# Patient Record
Sex: Female | Born: 1995 | Race: Black or African American | Hispanic: No | Marital: Single | Smoking: Never smoker
Health system: Southern US, Community
[De-identification: ages and names within clinical notes are randomized; demographics above are authoritative.]

## PROBLEM LIST (undated history)

## (undated) DIAGNOSIS — Z889 Allergy status to unspecified drugs, medicaments and biological substances status: Secondary | ICD-10-CM

## (undated) DIAGNOSIS — K200 Eosinophilic esophagitis: Secondary | ICD-10-CM

## (undated) DIAGNOSIS — N200 Calculus of kidney: Secondary | ICD-10-CM

## (undated) HISTORY — DX: Calculus of kidney: N20.0

## (undated) HISTORY — DX: Allergy status to unspecified drugs, medicaments and biological substances: Z88.9

## (undated) HISTORY — DX: Eosinophilic esophagitis: K20.0

---
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
## 2014-06-17 ENCOUNTER — Telehealth: Payer: Self-pay

## 2014-06-17 NOTE — Telephone Encounter (Signed)
Patient called and left a message stating that she needs an access code for mychart. Please return call and advise. Thank you.

## 2014-06-17 NOTE — Telephone Encounter (Signed)
Generated Activation Code: Activation Code: [id number] Generate CodeInvalidate Code Code Expiration: 08/16/2014 2:37 PM Need to verify pt prior to giving her the access code.  Unable to leave message on cell phone-home phone.

## 2014-06-18 NOTE — Telephone Encounter (Signed)
Spoke to pt, access code given and read back for confirmation.

## 2014-07-03 ENCOUNTER — Other Ambulatory Visit: Payer: Self-pay | Admitting: Family Medicine

## 2014-07-03 DIAGNOSIS — N63 Unspecified lump in unspecified breast: Secondary | ICD-10-CM

## 2014-07-22 ENCOUNTER — Ambulatory Visit
Admission: RE | Admit: 2014-07-22 | Discharge: 2014-07-22 | Disposition: A | Discharge status: Home / Self Care | Payer: BC Managed Care – PPO | Source: Ambulatory Visit | Attending: Family Medicine | Admitting: Family Medicine

## 2014-07-22 DIAGNOSIS — N63 Unspecified lump in unspecified breast: Secondary | ICD-10-CM

## 2014-12-31 ENCOUNTER — Encounter: Payer: Self-pay | Admitting: Family Medicine

## 2014-12-31 ENCOUNTER — Ambulatory Visit (INDEPENDENT_AMBULATORY_CARE_PROVIDER_SITE_OTHER): Payer: BLUE CROSS/BLUE SHIELD | Admitting: Family Medicine

## 2014-12-31 VITALS — BP 94/62 | HR 62 | Temp 98.4°F | Resp 16 | Ht 66.25 in | Wt 120.0 lb

## 2014-12-31 DIAGNOSIS — Z0000 Encounter for general adult medical examination without abnormal findings: Secondary | ICD-10-CM

## 2014-12-31 DIAGNOSIS — Z87442 Personal history of urinary calculi: Secondary | ICD-10-CM | POA: Insufficient documentation

## 2014-12-31 DIAGNOSIS — R1013 Epigastric pain: Secondary | ICD-10-CM

## 2014-12-31 MED ORDER — FLUTICASONE PROPIONATE 50 MCG/ACT NA SUSP: NASAL | Status: DC

## 2014-12-31 MED ORDER — FAMOTIDINE 10 MG PO TABS: ORAL_TABLET | ORAL | Status: DC

## 2014-12-31 NOTE — Patient Instructions (Signed)
Keeping You Healthy  Get These Tests  Blood Pressure- Have your blood pressure checked once a year by your health care provider.  Normal blood pressure is 120/80.  Weight- Have your body mass index (BMI) calculated to screen for obesity.  BMI is measure of body fat based on height and weight.  You can also calculate your own BMI at GravelBags.it.  Cholesterol- Have your cholesterol checked every 5 years starting at age 20 then yearly starting at age 65.  Chlamydia, HIV, and other sexually transmitted diseases- Get screened every year until age 53, then within three months of each new sexual provider.  Pap Smear- Every 1-3 years; discuss with your health care provider.  Mammogram- Every year starting at age 64  Take these medicines  Calcium with Vitamin D-Your body needs 1200 mg of Calcium each day and [phone] IU of Vitamin D daily.  Your body can only absorb 500 mg of Calcium at a time so Calcium must be taken in 2 or 3 divided doses throughout the day.  Multivitamin with folic acid- Once daily if it is possible for you to become pregnant.  Get these Immunizations  Gardasil-Series of three doses; prevents HPV related illness such as genital warts and cervical cancer.  Menactra-Single dose; prevents meningitis.  Tetanus shot- Every 10 years.  Flu shot-Every year.  Take these steps  Do not smoke-Your healthcare provider can help you quit.  For tips on how to quit go to www.smokefree.gov or call 1-800 QUITNOW.  Be physically active- Exercise 5 days a week for at least 30 minutes.  If you are not already physically active, start slow and gradually work up to 30 minutes of moderate physical activity.  Examples of moderate activity include walking briskly, dancing, swimming, bicycling, etc.  Breast Cancer- A self breast exam every month is important for early detection of breast cancer.  For more information and instruction on self breast exams, ask your healthcare  provider or https://www.patel.info/.  Eat a healthy diet- Eat a variety of healthy foods such as fruits, vegetables, whole grains, low fat milk, low fat cheeses, yogurt, lean meats, poultry and fish, beans, nuts, tofu, etc.  For more information go to www. Thenutritionsource.org  Drink alcohol in moderation- Limit alcohol intake to one drink or less per day. Never drink and drive.  Depression- Your emotional health is as important as your physical health.  If you're feeling down or losing interest in things you normally enjoy please talk to your healthcare provider about being screened for depression.  Dental visit- Brush and floss your teeth twice daily; visit your dentist twice a year.  Eye doctor- Get an eye exam at least every 2 years.  Helmet use- Always wear a helmet when riding a bicycle, motorcycle, rollerblading or skateboarding.  Safe sex- If you may be exposed to sexually transmitted infections, use a condom.  Seat belts- Seat belts can save your live; always wear one.  Smoke/Carbon Monoxide detectors- These detectors need to be installed on the appropriate level of your home. Replace batteries at least once a year.  Skin cancer- When out in the sun please cover up and use sunscreen 15 SPF or higher.  Violence- If anyone is threatening or hurting you, please tell your healthcare provider.    Indigestion  Indigestion is discomfort in the upper belly (abdomen). HOME CARE  Avoid foods and drinks that make your problems worse. You may want to avoid:  Caffeine and alcohol.  Chocolate.  Peppermint.  Garlic  and onions.  Spicy foods.  Citrus fruits, such as oranges, lemons, or limes.  Tomato-based foods such as sauce, chili, salsa, and pizza.  Fried and fatty foods.  Avoid eating for 3 hours before your bedtime.  Eat small meals instead of large meals more often.  Stop smoking if you smoke.  Maintain a healthy weight.  Wear  loose-fitting clothing. Do not wear anything tight around your waist.  Raise the head of your bed 4 to 8 inches with wood blocks.  Only take medicines as told by your doctor.  Do not take aspirin or ibuprofen. GET HELP RIGHT AWAY IF:  You are not better after 2 days.  You have chest pain that goes into your neck, arms, back, jaw, or upper belly.  You have trouble swallowing.  You keep throwing up (vomiting).  You have black or bloody poop (stool).  You have a fever.  You have trouble breathing, you feel dizzy, or you pass out (faint).  You are sweating a lot.  You have severe belly pain.  You lose weight without trying. MAKE SURE YOU:  Understand these instructions.  Will watch your condition.  Will get help right away if you are not doing well or get worse. Document Released: 01/13/2011 Document Revised: 03/04/2012 Document Reviewed: 07/26/2011 [facility] Patient Information 2015 [place], [place]. This information is not intended to replace advice given to you by your health care provider. Make sure you discuss any questions you have with your health care provider.    Mylanta is safe for you to take; take the lowest effective dose and make changes in your dietary habits. Another over-the-counter medication that works is [employer].     HPV Vaccine Gardasil (Human Papillomavirus): What You Need to Know 1. What is HPV? Genital human papillomavirus (HPV) is the most common sexually transmitted virus in the [place]. More than half of sexually active men and women are infected with HPV at some time in their lives. About 20 million Americans are currently infected, and about 6 million more get infected each year. HPV is usually spread through sexual contact. Most HPV infections don't cause any symptoms, and go away on their own. But HPV can cause cervical cancer in women. Cervical cancer is the 2nd leading cause of cancer deaths among women around the world. In the  [place], about 12,000 women get cervical cancer every year and about 4,000 are expected to die from it. HPV is also associated with several less common cancers, such as vaginal and vulvar cancers in women, and anal and oropharyngeal (back of the throat, including base of tongue and tonsils) cancers in both men and women. HPV can also cause genital warts and warts in the throat. There is no cure for HPV infection, but some of the problems it causes can be treated. 2. HPV vaccine: Why get vaccinated? The HPV vaccine you are getting is one of two vaccines that can be given to prevent HPV. It may be given to both males and females.  This vaccine can prevent most cases of cervical cancer in females, if it is given before exposure to the virus. In addition, it can prevent vaginal and vulvar cancer in females, and genital warts and anal cancer in both males and females. Protection from HPV vaccine is expected to be long-lasting. But vaccination is not a substitute for cervical cancer screening. Women should still get regular Pap tests. 3. Who should get this HPV vaccine and when? HPV vaccine is given  as a 3-dose series  1st Dose: Now  2nd Dose: 1 to 2 months after Dose 1  3rd Dose: 6 months after Dose 1 Additional (booster) doses are not recommended. Routine vaccination  This HPV vaccine is recommended for girls and boys 2 or 20 years of age. It may be given starting at age 47. Why is HPV vaccine recommended at 43 or 20 years of age?  HPV infection is easily acquired, even with only one sex partner. That is why it is important to get HPV vaccine before any sexual contact takes place. Also, response to the vaccine is better at this age than at older ages. Catch-up vaccination This vaccine is recommended for the following people who have not completed the 3-dose series:   Females 54 through 20 years of age.  Males 79 through 20 years of age. This vaccine may be given to men 45 through 20  years of age who have not completed the 3-dose series. It is recommended for men through age 76 who have sex with men or whose immune system is weakened because of HIV infection, other illness, or medications.  HPV vaccine may be given at the same time as other vaccines. 4. Some people should not get HPV vaccine or should wait.  Anyone who has ever had a life-threatening allergic reaction to any component of HPV vaccine, or to a previous dose of HPV vaccine, should not get the vaccine. Tell your doctor if the person getting vaccinated has any severe allergies, including an allergy to yeast.  HPV vaccine is not recommended for pregnant women. However, receiving HPV vaccine when pregnant is not a reason to consider terminating the pregnancy. Women who are breast feeding may get the vaccine.  People who are mildly ill when a dose of HPV is planned can still be vaccinated. People with a moderate or severe illness should wait until they are better. 5. What are the risks from this vaccine? This HPV vaccine has been used in the [place] and around the world for about six years and has been very safe. However, any medicine could possibly cause a serious problem, such as a severe allergic reaction. The risk of any vaccine causing a serious injury, or death, is extremely small. Life-threatening allergic reactions from vaccines are very rare. If they do occur, it would be within a few minutes to a few hours after the vaccination. Several mild to moderate problems are known to occur with this HPV vaccine. These do not last long and go away on their own.  Reactions in the arm where the shot was given:  Pain (about 8 people in 10)  Redness or swelling (about 1 person in 4)  Fever:  Mild (100 F) (about 1 person in 10)  Moderate (102 F) (about 1 person in 76)  Other problems:  Headache (about 1 person in 3)  Fainting: Brief fainting spells and related symptoms (such as jerking movements) can happen  after any medical procedure, including vaccination. Sitting or lying down for about 15 minutes after a vaccination can help prevent fainting and injuries caused by falls. Tell your doctor if the patient feels dizzy or light-headed, or has vision changes or ringing in the ears.  Like all vaccines, HPV vaccines will continue to be monitored for unusual or severe problems. 6. What if there is a serious reaction? What should I look for?  Look for anything that concerns you, such as signs of a severe allergic reaction, very high fever, or  behavior changes. Signs of a severe allergic reaction can include hives, swelling of the face and throat, difficulty breathing, a fast heartbeat, dizziness, and weakness. These would start a few minutes to a few hours after the vaccination.  What should I do?  If you think it is a severe allergic reaction or other emergency that can't wait, call 9-1-1 or get the person to the nearest hospital. Otherwise, call your doctor.  Afterward, the reaction should be reported to the Vaccine Adverse Event Reporting System (VAERS). Your doctor might file this report, or you can do it yourself through the VAERS web site at www.vaers.SamedayNews.es, or by calling 858-546-6615. VAERS is only for reporting reactions. They do not give medical advice. 7. The National Vaccine Injury Compensation Program  The [employer] Vaccine Injury Compensation Program (VICP) is a federal program that was created to compensate people who may have been injured by certain vaccines.  Persons who believe they may have been injured by a vaccine can learn about the program and about filing a claim by calling 325-317-5517 or visiting the [place] website at GoldCloset.com.ee. 8. How can I learn more?  Ask your doctor.  Call your local or state health department.  Contact the Centers for Disease Control and Prevention (CDC):  Call 913-258-3193 (1-800-CDC-INFO)  or  Visit CDC's website at  http://hunter.com/ CDC Human Papillomavirus (HPV) Gardasil (Interim) 05/10/12 Document Released: 10/08/2006 Document Revised: 04/27/2014 Document Reviewed: 01/22/2014 [employer] Patient Information 2015 [place], [place]. This information is not intended to replace advice given to you by your health care provider. Make sure you discuss any questions you have with your health care provider.

## 2014-12-31 NOTE — Progress Notes (Signed)
Subjective:    Patient ID: April Osborne, female    DOB: 1996/04/29, 19 y.o.   MRN: [id number]  HPI  This 19 y.o. Female is here for CPE (no PAP- not sexually active). Pt is a Secondary school teacher at [employer], [employer] course of study. She has hx of mild anemia and fatigue which responded to oral iron. She reports improved nutrition since she consumes many of her meals in the campus cafeteria.   Patient Active Problem List   Diagnosis Date Noted  . Personal history of kidney stones 12/31/2014  . Acne 05/31/2013  . Allergic rhinitis 12/27/2012  . Benign breast lumps 12/27/2012  . Scoliosis 02/06/2012    Prior to Admission medications   Medication Sig Start Date End Date Taking? Authorizing Provider  cetirizine (ZYRTEC) 10 MG tablet Take 10 mg by mouth daily.   Yes Historical Provider, MD  OVER THE COUNTER MEDICATION daily.   Yes Historical Provider, MD  tretinoin (RETIN-A) 0.1 % cream Apply sparingly to clean dry skin on acne-prone areas on face and upper lip. Use at bedtime. 05/30/13  Yes Barton Fanny, MD  fluticasone [facility]) 50 MCG/ACT nasal spray Spray 1-2 times in each nostril at bedtime.    Barton Fanny, MD    History   Social History  . Marital Status: Single    Spouse Name: N/A    Number of Children: N/A  . Years of Education: N/A   Occupational History  . Student- [employer].    Social History Main Topics  . Smoking status: Never Smoker   . Smokeless tobacco: Never Used  . Alcohol Use: No  . Drug Use: No  . Sexual Activity: No   Other Topics Concern  . Not on file   Social History Narrative    Family History  Problem Relation Age of Onset  . Hypertension Father   . Cancer Paternal Grandmother   . Diabetes Maternal Grandfather   . Dementia Maternal Grandfather   . Hypertension Mother     Review of Systems  Constitutional: Negative.   HENT: Negative.   Eyes: Negative.   Cardiovascular: Negative.   Gastrointestinal:  Negative.        Occasional gas and stomach aches; increased symptoms when eating late and going to bed on full stomach. Mylanta is effective.  Endocrine: Negative.   Genitourinary: Negative.        Irregular menses for 2 months this summer- short 21-day cycle but bleeding lasts 3-4 days. Pt is not sexually active.  Musculoskeletal: Negative.   Skin: Negative.   Allergic/Immunologic: Positive for environmental allergies.  Neurological: Negative.   Hematological: Negative.   Psychiatric/Behavioral: Negative.        Objective:   Physical Exam  Constitutional: She is oriented to person, place, and time. Vital signs are normal. She appears well-developed and well-nourished. No distress.  HENT:  Head: Normocephalic and atraumatic.  Right Ear: Hearing, tympanic membrane, external ear and ear canal normal.  Left Ear: Hearing, tympanic membrane, external ear and ear canal normal.  Nose: Nose normal. No nasal deformity or septal deviation.  Mouth/Throat: Uvula is midline, oropharynx is clear and moist and mucous membranes are normal. No oral lesions. Normal dentition. No dental caries. No oropharyngeal exudate.  Eyes: Conjunctivae, EOM and lids are normal. Pupils are equal, round, and reactive to light. No scleral icterus.  Fundoscopic exam:      The right eye shows no papilledema. The right eye shows red reflex.  The left eye shows no papilledema. The left eye shows red reflex.  Neck: Trachea normal, normal range of motion, full passive range of motion without pain and phonation normal. Neck supple. No spinous process tenderness and no muscular tenderness present. No thyroid mass and no thyromegaly present.  Cardiovascular: Normal rate, regular rhythm, S1 normal, S2 normal, normal heart sounds and normal pulses.  PMI is not displaced.   No murmur heard. Pulmonary/Chest: Effort normal and breath sounds normal. No respiratory distress.  Abdominal: Soft. Normal appearance and bowel sounds  are normal. She exhibits no distension and no mass. There is no hepatosplenomegaly. There is no tenderness. There is no guarding and no CVA tenderness.  Genitourinary:  Deferred.  Musculoskeletal:       Cervical back: Normal.       Thoracic back: Normal.       Lumbar back: Normal.  Remainder of exam unremarkable.  Lymphadenopathy:       Head (right side): No submental, no submandibular, no tonsillar, no preauricular, no posterior auricular and no occipital adenopathy present.       Head (left side): No submental, no submandibular, no tonsillar, no preauricular, no posterior auricular and no occipital adenopathy present.    She has no cervical adenopathy.    She has no axillary adenopathy.       Right: No inguinal and no supraclavicular adenopathy present.       Left: No inguinal and no supraclavicular adenopathy present.  Neurological: She is alert and oriented to person, place, and time. She has normal strength and normal reflexes. She displays no atrophy. No cranial nerve deficit or sensory deficit. She exhibits normal muscle tone. Coordination and gait normal.  Skin: Skin is warm and intact. No ecchymosis, no lesion and no rash noted. She is not diaphoretic. No cyanosis or erythema. Nails show no clubbing.  Psychiatric: She has a normal mood and affect. Her speech is normal and behavior is normal. Judgment and thought content normal. Cognition and memory are normal.  Nursing note and vitals reviewed.      Assessment & Plan:  Healthcare maintenance- Advised about Gardisil vaccine; info given.  Dyspepsia- Pt uses Mylanta but concerned about risk of kidney stones. Trial Pepcid AC 20 mg daily.   Meds ordered this encounter  Medications  . fluticasone (FLONASE) 50 MCG/ACT nasal spray    Sig: Spray 1-2 times in each nostril at bedtime.    Dispense:  16 g    Refill:  5  . famotidine (PEPCID AC) 10 MG tablet    Sig: Take 2 tablets once a day.    Dispense:  60 tablet    Refill:  2

## 2015-04-27 ENCOUNTER — Telehealth: Payer: Self-pay | Admitting: Family Medicine

## 2015-04-27 NOTE — Telephone Encounter (Signed)
No answer on cell voice mail box not set up home number disconnected

## 2015-07-27 ENCOUNTER — Other Ambulatory Visit: Payer: Self-pay | Admitting: Family Medicine

## 2015-07-27 DIAGNOSIS — D242 Benign neoplasm of left breast: Principal | ICD-10-CM

## 2015-07-27 DIAGNOSIS — D241 Benign neoplasm of right breast: Secondary | ICD-10-CM

## 2015-08-12 ENCOUNTER — Other Ambulatory Visit: Payer: Self-pay | Admitting: Physician Assistant

## 2015-08-12 DIAGNOSIS — D241 Benign neoplasm of right breast: Secondary | ICD-10-CM

## 2015-08-12 DIAGNOSIS — D242 Benign neoplasm of left breast: Principal | ICD-10-CM

## 2015-08-13 ENCOUNTER — Ambulatory Visit
Admission: RE | Admit: 2015-08-13 | Discharge: 2015-08-13 | Disposition: A | Discharge status: Home / Self Care | Payer: BLUE CROSS/BLUE SHIELD | Source: Ambulatory Visit | Attending: Family Medicine | Admitting: Family Medicine

## 2015-08-13 DIAGNOSIS — D241 Benign neoplasm of right breast: Secondary | ICD-10-CM

## 2015-08-13 DIAGNOSIS — D242 Benign neoplasm of left breast: Principal | ICD-10-CM

## 2016-01-04 ENCOUNTER — Encounter: Payer: BLUE CROSS/BLUE SHIELD | Admitting: Family Medicine

## 2016-12-03 IMAGING — US US BREAST LTD UNI RIGHT INC AXILLA
1 series · 8 of 8 positions shown · non-contrast
Comparison: Previous exam(s).

CLINICAL DATA: Year followup for probably benign masses in each
breast. These were initially evaluated in May 2013.

EXAM:
ULTRASOUND OF THE BILATERAL BREAST

[Series 1: us breast ltd uni right inc axilla · 0.05mm/px · 8 of 8 slices shown]
[im 1/8]
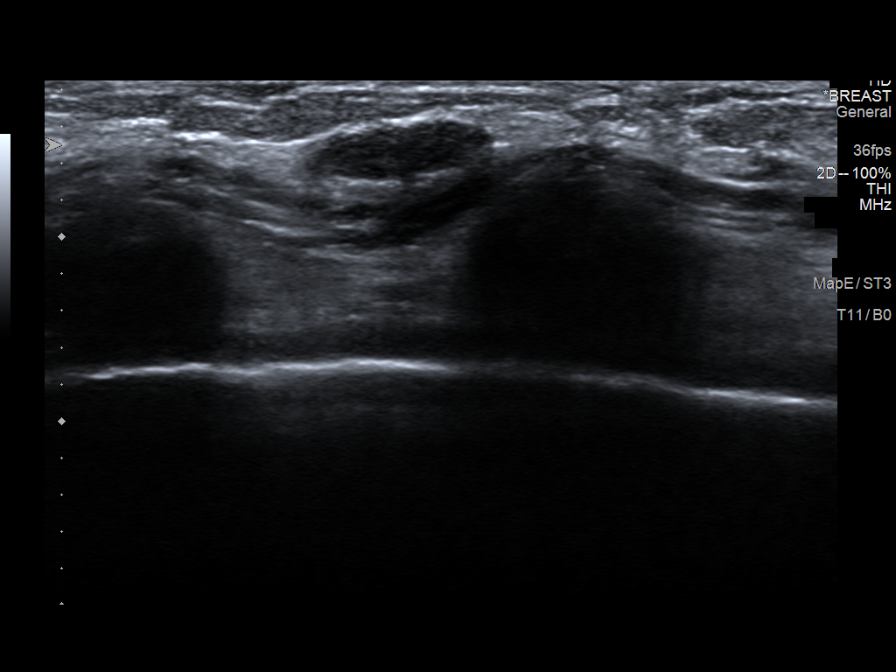
[im 2/8]
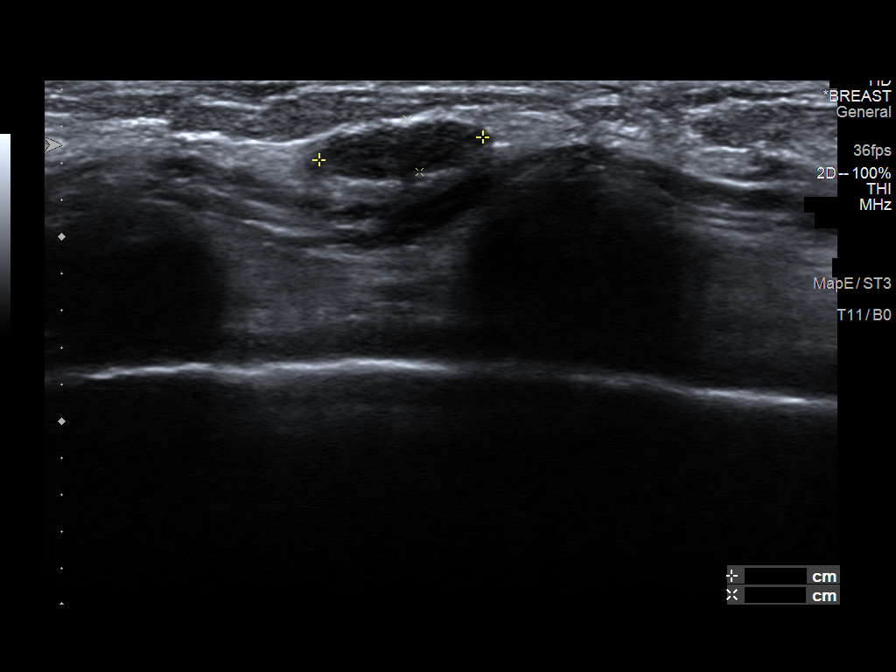
[im 3/8]
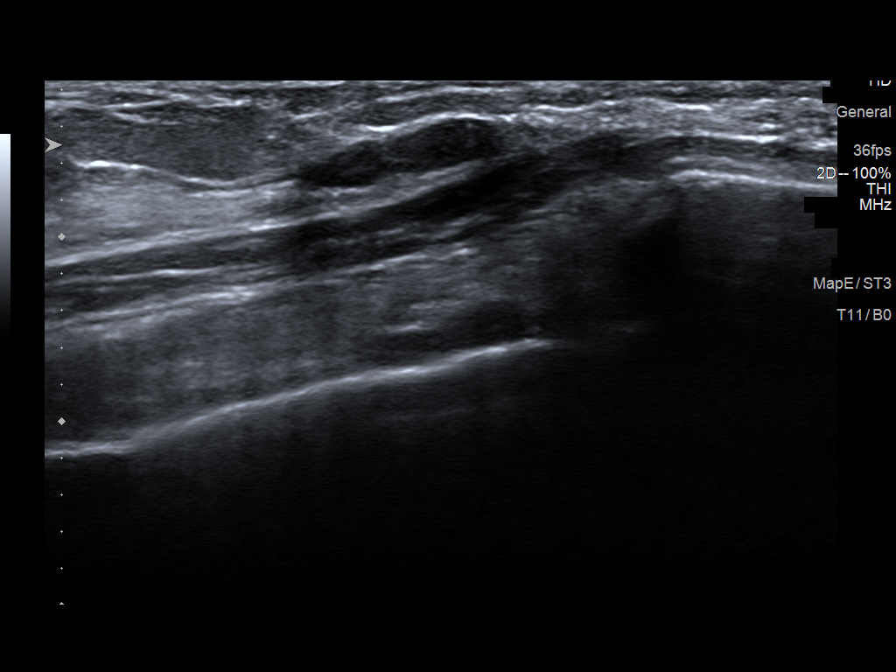
[im 4/8]
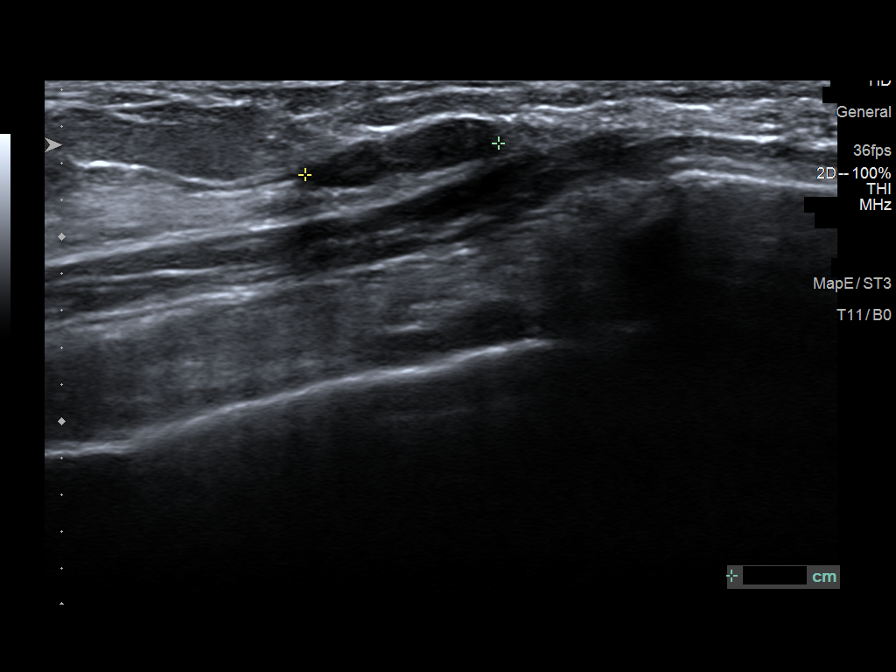
[im 5/8]
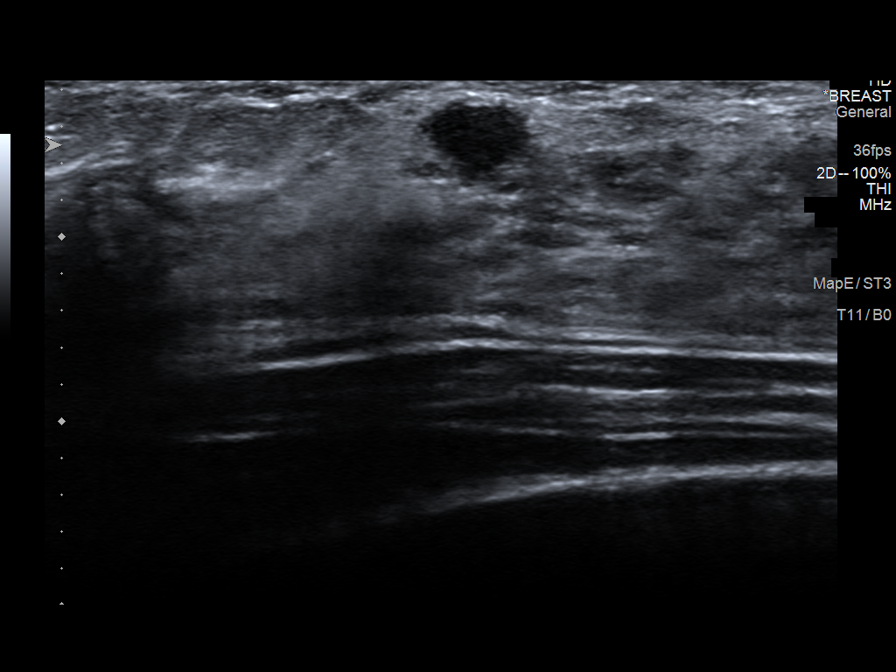
[im 6/8]
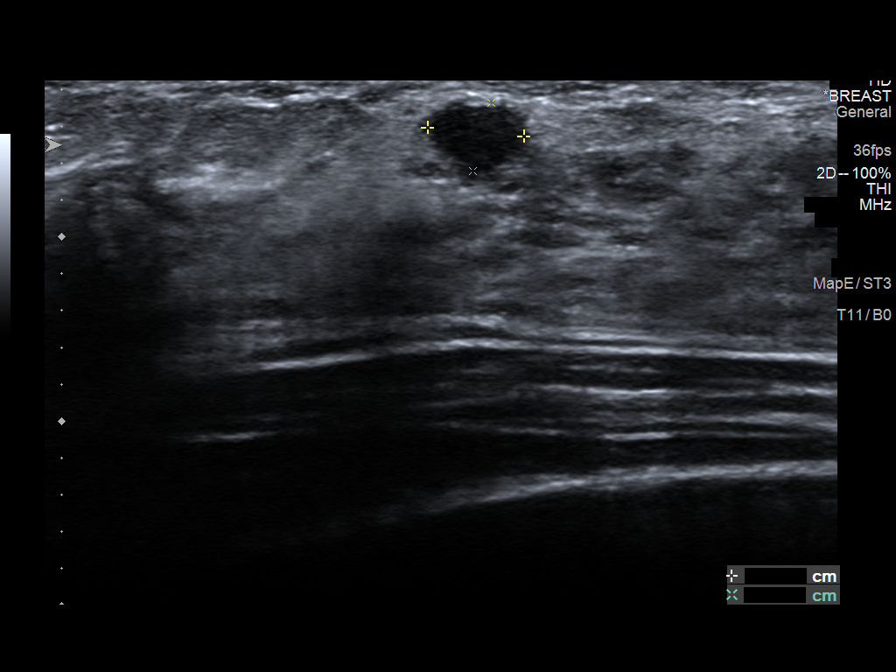
[im 7/8]
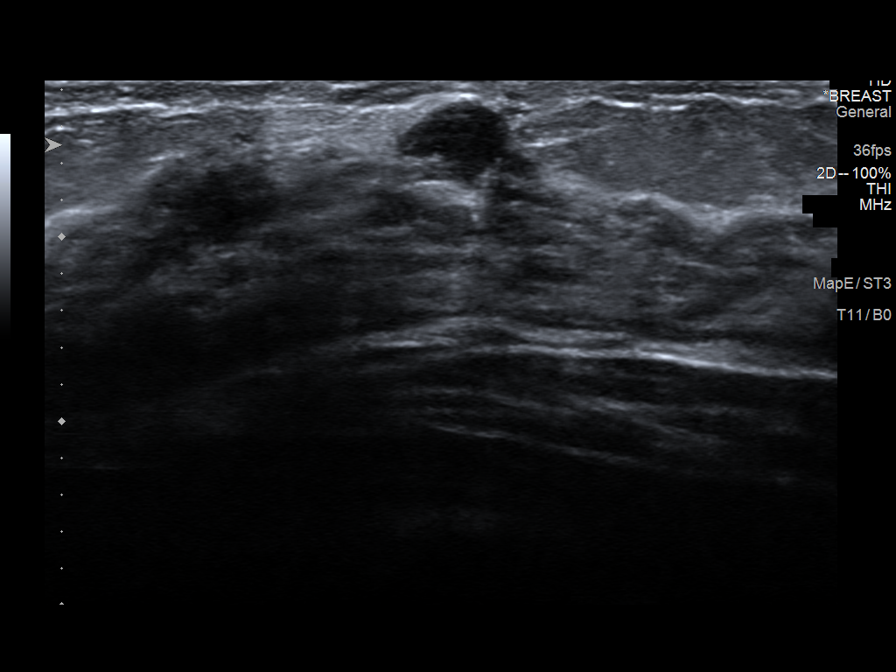
[im 8/8]
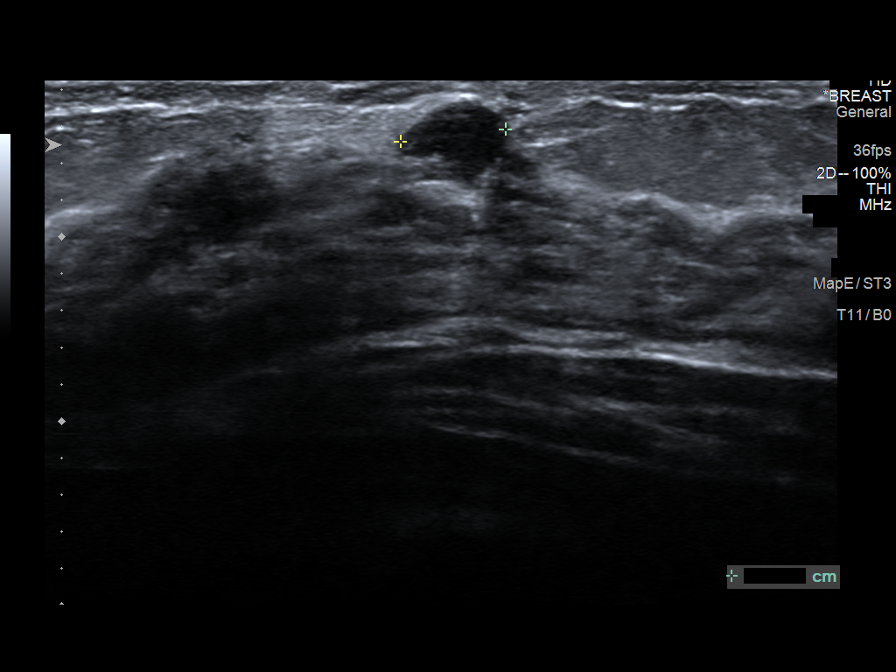

[8 of 8 positions shown; findings below may reference images not displayed]

FINDINGS: On physical exam, smooth palpable mobile masses noted in the left
breast at 9 o'clock.

Targeted ultrasound of the left breast demonstrates 2 masses, the
larger palpable mass at 9 o'clock, 3 cm the nipple, measuring 2.8 x
1.2 x 2.6 cm. The smaller mass lies at the 2 o'clock position, 3 cm
the nipple, measuring 1.7 x 0.4 x 1.7 cm. Targeted ultrasound of the
right breast demonstrates 2 masses, 1 in the 3 o'clock position, 7
cm the nipple, measuring 9 mm x 3 mm x 11 mm and the other in the 10
o'clock position, 3 cm the nipple on measuring 5 mm x 4 mm x 6 mm.

Allowing for slight differences in measurement technique, there has
been no change in these masses, in either breast, over a 2 year
interval. These are benign fibroadenomas.
IMPRESSION: Bilateral benign breast masses. No additional short-term followup
indicated.

RECOMMENDATION:
Screening mammogram at age 39 unless there are persistent or
intervening clinical concerns. (Code:SR-T-XZQ)

I have discussed the findings and recommendations with the patient.
Results were also provided in writing at the conclusion of the
visit. If applicable, a reminder letter will be sent to the patient
regarding the next appointment.

BI-RADS CATEGORY  2: Benign Finding(s)

## 2016-12-03 IMAGING — US US BREAST LTD UNI LEFT INC AXILLA
1 series · 8 of 8 positions shown · non-contrast
Comparison: Previous exam(s).

CLINICAL DATA: Year followup for probably benign masses in each
breast. These were initially evaluated in May 2013.

EXAM:
ULTRASOUND OF THE BILATERAL BREAST

[Series 1: us breast ltd uni left inc axilla · 0.07mm/px · 8 of 8 slices shown]
[im 1/8]
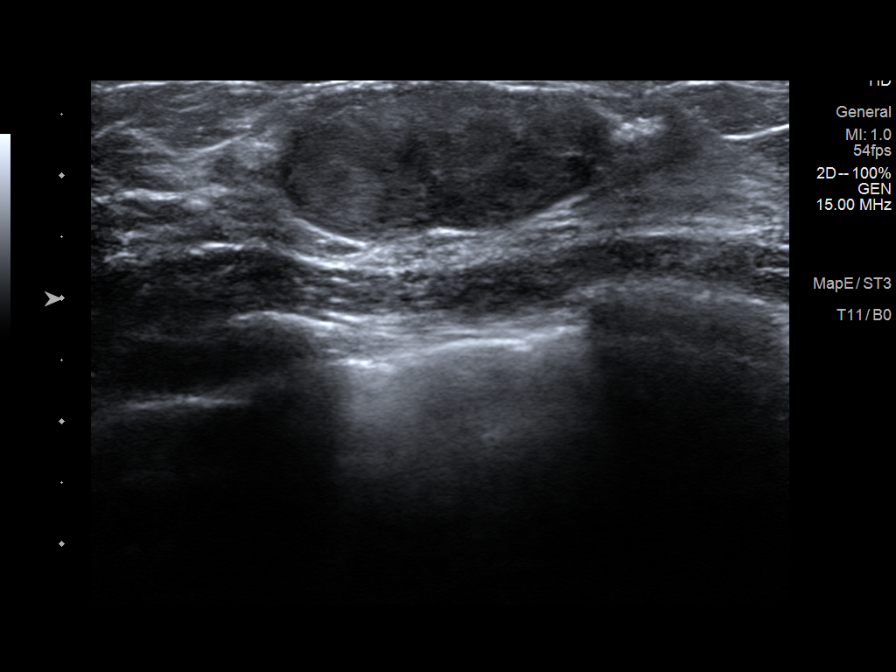
[im 2/8]
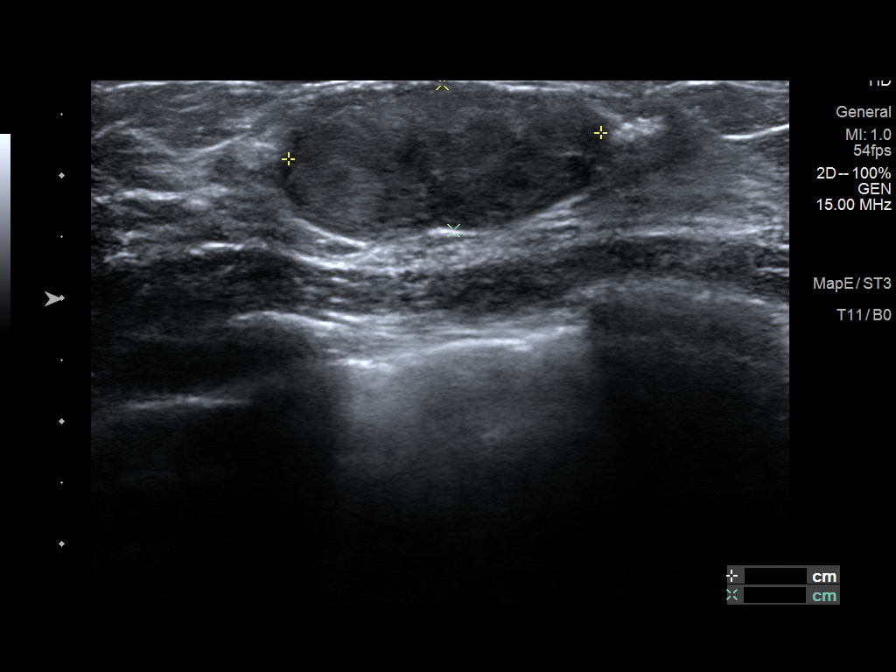
[im 3/8]
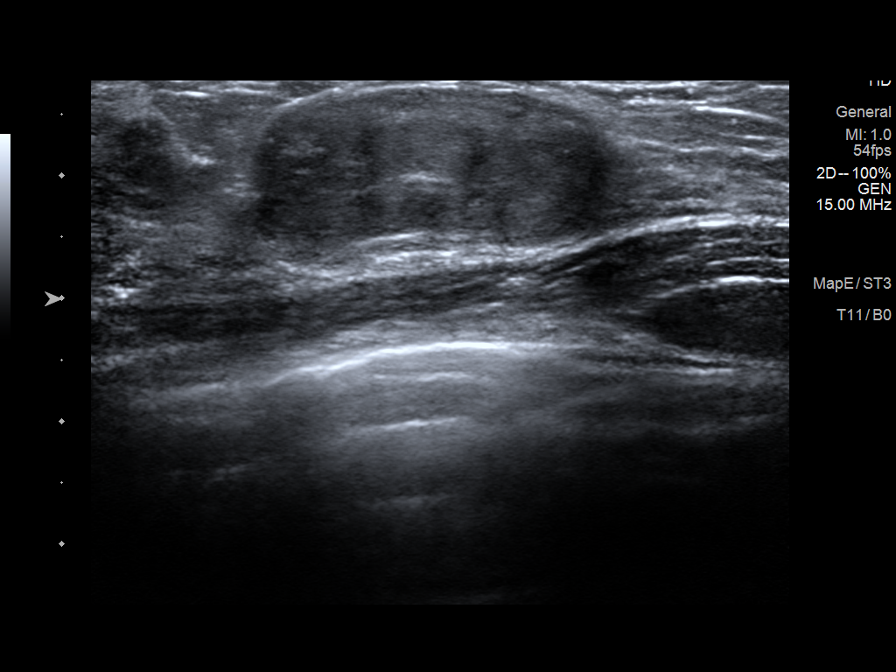
[im 4/8]
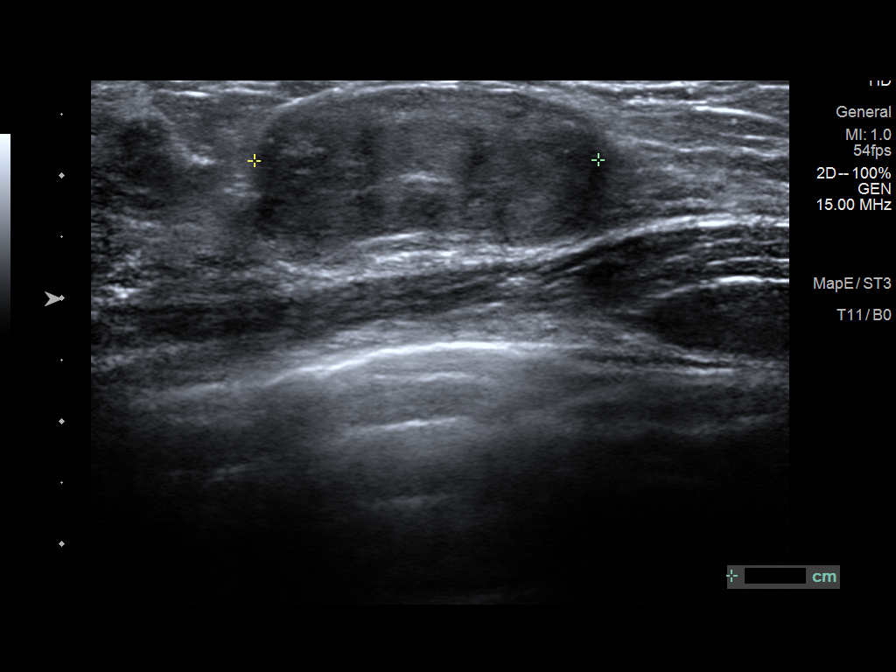
[im 5/8]
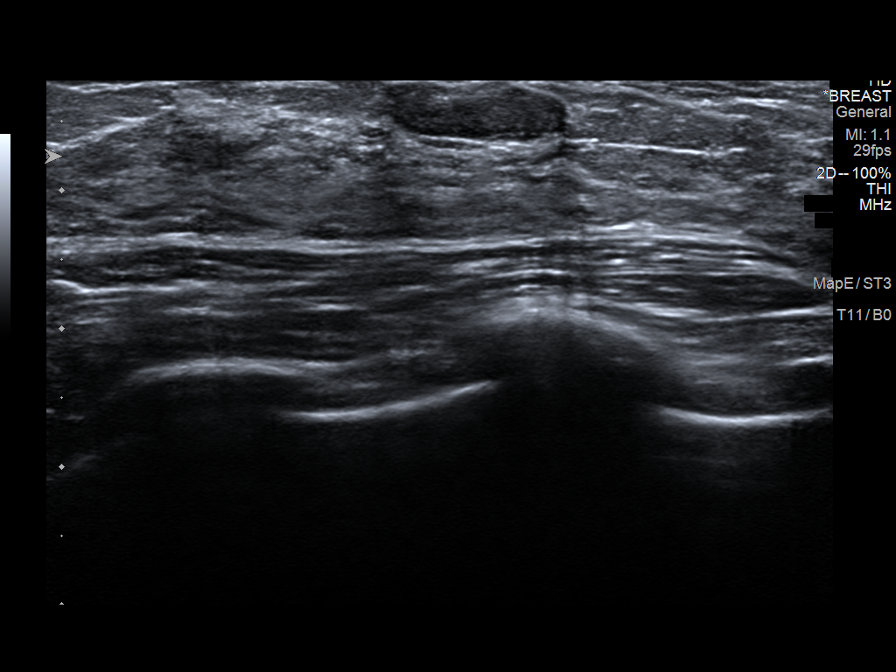
[im 6/8]
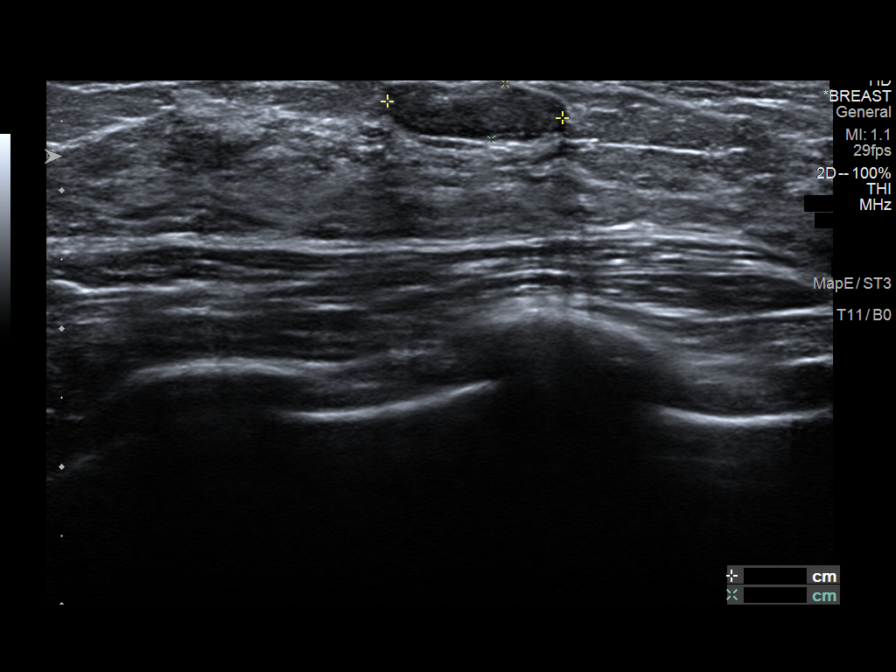
[im 7/8]
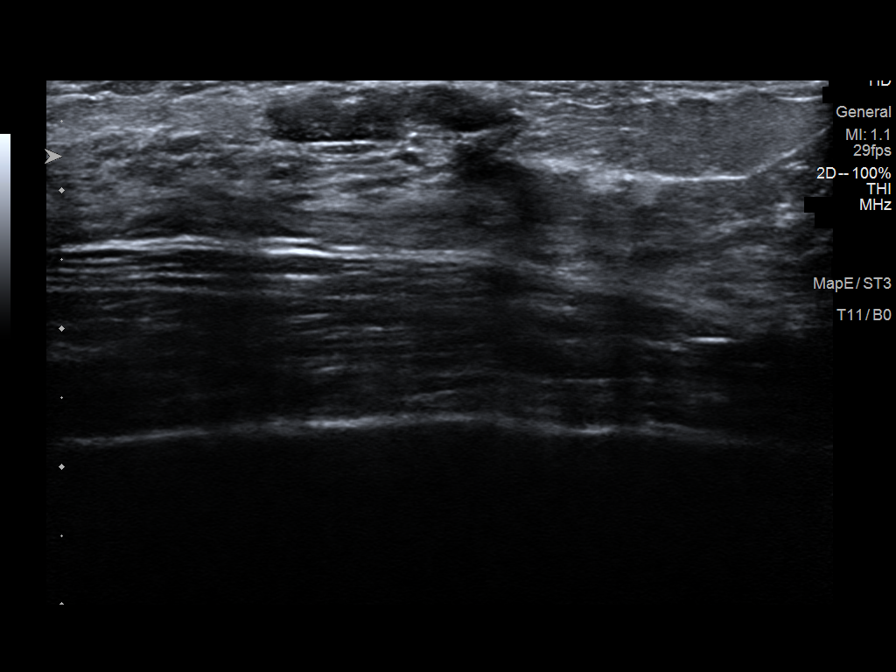
[im 8/8]
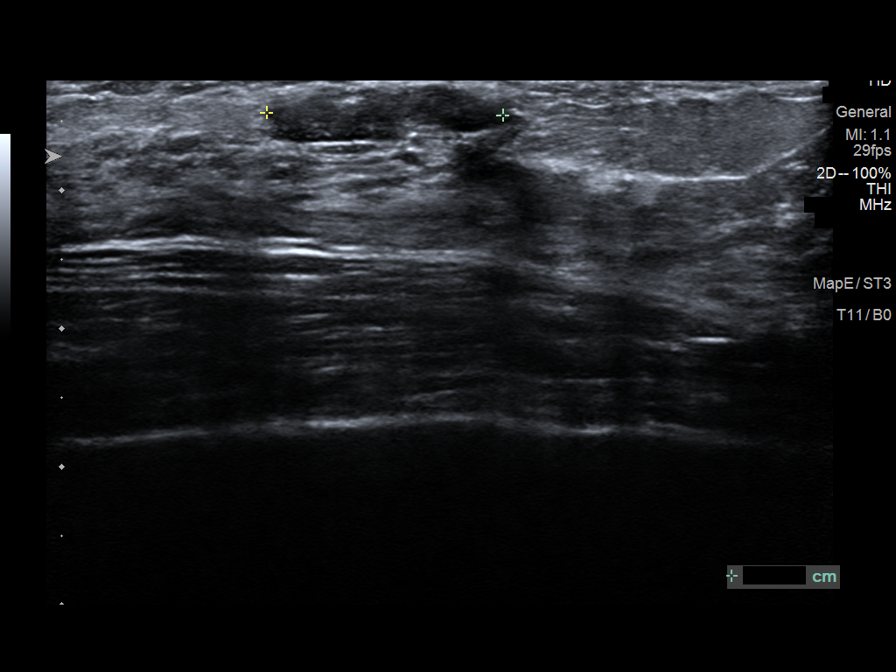

[8 of 8 positions shown; findings below may reference images not displayed]

FINDINGS: On physical exam, smooth palpable mobile masses noted in the left
breast at 9 o'clock.

Targeted ultrasound of the left breast demonstrates 2 masses, the
larger palpable mass at 9 o'clock, 3 cm the nipple, measuring 2.8 x
1.2 x 2.6 cm. The smaller mass lies at the 2 o'clock position, 3 cm
the nipple, measuring 1.7 x 0.4 x 1.7 cm. Targeted ultrasound of the
right breast demonstrates 2 masses, 1 in the 3 o'clock position, 7
cm the nipple, measuring 9 mm x 3 mm x 11 mm and the other in the 10
o'clock position, 3 cm the nipple on measuring 5 mm x 4 mm x 6 mm.

Allowing for slight differences in measurement technique, there has
been no change in these masses, in either breast, over a 2 year
interval. These are benign fibroadenomas.
IMPRESSION: Bilateral benign breast masses. No additional short-term followup
indicated.

RECOMMENDATION:
Screening mammogram at age 39 unless there are persistent or
intervening clinical concerns. (Code:SR-T-XZQ)

I have discussed the findings and recommendations with the patient.
Results were also provided in writing at the conclusion of the
visit. If applicable, a reminder letter will be sent to the patient
regarding the next appointment.

BI-RADS CATEGORY  2: Benign Finding(s)

## 2017-06-19 ENCOUNTER — Other Ambulatory Visit: Payer: Self-pay | Admitting: Physician Assistant

## 2017-06-19 DIAGNOSIS — N63 Unspecified lump in unspecified breast: Secondary | ICD-10-CM

## 2017-07-10 ENCOUNTER — Ambulatory Visit
Admission: RE | Admit: 2017-07-10 | Discharge: 2017-07-10 | Disposition: A | Discharge status: Home / Self Care | Payer: BLUE CROSS/BLUE SHIELD | Source: Ambulatory Visit | Attending: Physician Assistant | Admitting: Physician Assistant

## 2017-07-10 ENCOUNTER — Other Ambulatory Visit: Payer: Self-pay | Admitting: Physician Assistant

## 2017-07-10 DIAGNOSIS — N63 Unspecified lump in unspecified breast: Secondary | ICD-10-CM

## 2017-07-10 DIAGNOSIS — D242 Benign neoplasm of left breast: Secondary | ICD-10-CM

## 2018-01-14 ENCOUNTER — Ambulatory Visit
Admission: RE | Admit: 2018-01-14 | Discharge: 2018-01-14 | Disposition: A | Discharge status: Home / Self Care | Payer: BLUE CROSS/BLUE SHIELD | Source: Ambulatory Visit | Attending: Physician Assistant | Admitting: Physician Assistant

## 2018-01-14 ENCOUNTER — Other Ambulatory Visit: Payer: Self-pay | Admitting: Physician Assistant

## 2018-01-14 DIAGNOSIS — D242 Benign neoplasm of left breast: Secondary | ICD-10-CM

## 2018-07-15 ENCOUNTER — Other Ambulatory Visit: Payer: BLUE CROSS/BLUE SHIELD

## 2018-07-16 ENCOUNTER — Ambulatory Visit
Admission: RE | Admit: 2018-07-16 | Discharge: 2018-07-16 | Disposition: A | Discharge status: Home / Self Care | Payer: BLUE CROSS/BLUE SHIELD | Source: Ambulatory Visit | Attending: Physician Assistant | Admitting: Physician Assistant

## 2018-07-16 DIAGNOSIS — D242 Benign neoplasm of left breast: Secondary | ICD-10-CM

## 2018-07-17 DIAGNOSIS — R12 Heartburn: Secondary | ICD-10-CM | POA: Insufficient documentation

## 2018-07-17 DIAGNOSIS — K200 Eosinophilic esophagitis: Secondary | ICD-10-CM | POA: Insufficient documentation

## 2018-08-21 DIAGNOSIS — D242 Benign neoplasm of left breast: Secondary | ICD-10-CM

## 2018-08-21 DIAGNOSIS — D241 Benign neoplasm of right breast: Secondary | ICD-10-CM | POA: Insufficient documentation

## 2018-09-14 ENCOUNTER — Emergency Department (INDEPENDENT_AMBULATORY_CARE_PROVIDER_SITE_OTHER)
Admission: EM | Admit: 2018-09-14 | Discharge: 2018-09-14 | Disposition: A | Discharge status: Home / Self Care | Payer: BLUE CROSS/BLUE SHIELD | Source: Home / Self Care | Attending: Family Medicine | Admitting: Family Medicine

## 2018-09-14 ENCOUNTER — Other Ambulatory Visit: Payer: Self-pay

## 2018-09-14 DIAGNOSIS — R000 Tachycardia, unspecified: Secondary | ICD-10-CM

## 2018-09-14 DIAGNOSIS — R112 Nausea with vomiting, unspecified: Secondary | ICD-10-CM | POA: Diagnosis not present

## 2018-09-14 DIAGNOSIS — R42 Dizziness and giddiness: Secondary | ICD-10-CM

## 2018-09-14 DIAGNOSIS — E860 Dehydration: Secondary | ICD-10-CM | POA: Diagnosis not present

## 2018-09-14 MED ORDER — ONDANSETRON 4 MG PO TBDP: ORAL_TABLET | ORAL | 0 refills | Status: DC

## 2018-09-14 MED ORDER — SODIUM CHLORIDE 0.9 % IV SOLN
Freq: Once | INTRAVENOUS | Status: AC
  Administered 2018-09-14: 15:00:00 via INTRAVENOUS

## 2018-09-14 NOTE — Discharge Instructions (Signed)
°  Please follow up with family medicine later this week if symptoms keep returning.  Please call 911 or go to the closest hospital if symptoms worsening.

## 2018-09-14 NOTE — ED Triage Notes (Signed)
IV inserted by Candie Chroman RN.

## 2018-09-14 NOTE — ED Triage Notes (Signed)
Here with c/o nausea and vomiting after drinking 4 shots of Rum last night. Sx's started 12am this am. [facility] and given Zofran IM with relief. Mentined sinus sx's as well but is taking OTC Sudafed, Benadryl with relief. VSS

## 2018-09-14 NOTE — ED Provider Notes (Signed)
[facility]    CSN: [id number] Arrival date & time: 09/14/18  1355     History   Chief Complaint Chief Complaint  Patient presents with  . GI Problem    HPI April Osborne is a 22 y.o. female.   HPI  April Osborne is a 22 y.o. female presenting to UC with parents with c/o nausea and vomiting x8 since 12AM this morning after drinking 4 shots of Rum last night. She also reports having sinus congestion so she has been taking benadryl at least twice a day for the last week. She was taking Sudafed but stopped taking earlier in the week.  She has only been able to keep down a few sips of Gatorade today. Last episode of vomiting was around 11AM.  She was at a football game just PTA and felt dizzy and lightheaded. She was seen by a medic at the stadium and was given Zofran IM. Nausea has resolved. Pt was brought to UC for further evaluation of fluctuating heart rate, reported tachycardia. No prior known heart conditions. No family hx of heart problems.  Denies chest pain or palpitations at this time. Denies HA or dizziness at this time.    Past Medical History:  Diagnosis Date  . H/O seasonal allergies     Patient Active Problem List   Diagnosis Date Noted  . Personal history of kidney stones 12/31/2014  . Acne 05/31/2013  . Allergic rhinitis 12/27/2012  . Benign breast lumps 12/27/2012  . Scoliosis 02/06/2012    No past surgical history on file.  OB History   None      Home Medications    Prior to Admission medications   Medication Sig Start Date End Date Taking? Authorizing Provider  cetirizine (ZYRTEC) 10 MG tablet Take 10 mg by mouth daily.    [provider]  famotidine (PEPCID AC) 10 MG tablet Take 2 tablets once a day. 12/31/14   Barton Fanny, MD  fluticasone (FLONASE) 50 MCG/ACT nasal spray Spray 1-2 times in each nostril at bedtime. 12/31/14   Barton Fanny, MD  ondansetron (ZOFRAN ODT) 4 MG disintegrating tablet 4mg  ODT q4 hours prn  nausea/vomit 09/14/18   Miaa Latterell, Bronwen Betters, PA-C  OVER THE COUNTER MEDICATION daily.    [provider]  tretinoin (RETIN-A) 0.1 % cream Apply sparingly to clean dry skin on acne-prone areas on face and upper lip. Use at bedtime. 05/30/13   Barton Fanny, MD    Family History Family History  Problem Relation Age of Onset  . Hypertension Mother   . Hypertension Father   . Cancer Paternal Grandmother   . Diabetes Maternal Grandfather   . Dementia Maternal Grandfather     Social History Social History   Tobacco Use  . Smoking status: Never Smoker  . Smokeless tobacco: Never Used  Substance Use Topics  . Alcohol use: No  . Drug use: No     Allergies   Patient has no known allergies.   Review of Systems Review of Systems  Constitutional: Negative for chills and fever.  HENT: Positive for congestion and rhinorrhea. Negative for sinus pressure and sinus pain.   Respiratory: Negative for cough and shortness of breath.   Gastrointestinal: Positive for abdominal pain (mild, generalized), nausea and vomiting. Negative for diarrhea.  Genitourinary: Negative for dysuria, frequency, hematuria, pelvic pain and urgency.  Neurological: Positive for dizziness and light-headedness. Negative for syncope, numbness and headaches.     Physical Exam Triage Vital Signs  ED Triage Vitals [09/14/18 1435]  Enc Vitals Group     BP 123/70     Pulse Rate (!) 119     Resp      Temp 98.2 F (36.8 C)     Temp Source Oral     SpO2 100 %     Weight 119 lb 12.8 oz (54.3 kg)     Height      Head Circumference      Peak Flow      Pain Score      Pain Loc      Pain Edu?      Excl. in [place]?    Orthostatic VS for the past 24 hrs:  BP- Lying Pulse- Lying BP- Sitting Pulse- Sitting BP- Standing at 0 minutes Pulse- Standing at 0 minutes  09/14/18 1614 116/73 104 122/78 98 122/88 106  09/14/18 1515 121/81 87 - - (!) 131/93 101    Updated Vital Signs BP 123/70 (BP Location: Right Arm)    Pulse (!) 119   Temp 98.2 F (36.8 C) (Oral)   Wt 119 lb 12.8 oz (54.3 kg)   LMP 08/27/2018   SpO2 100%   BMI 19.19 kg/m   Visual Acuity Right Eye Distance:   Left Eye Distance:   Bilateral Distance:    Right Eye Near:   Left Eye Near:    Bilateral Near:     Physical Exam  Constitutional: She is oriented to person, place, and time. She appears well-developed and well-nourished. No distress.  HENT:  Head: Normocephalic and atraumatic.  Right Ear: Tympanic membrane normal.  Left Ear: Tympanic membrane normal.  Nose: Nose normal. Right sinus exhibits no maxillary sinus tenderness and no frontal sinus tenderness. Left sinus exhibits no maxillary sinus tenderness and no frontal sinus tenderness.  Mouth/Throat: Uvula is midline, oropharynx is clear and moist and mucous membranes are normal.  Eyes: EOM are normal.  Neck: Normal range of motion. Neck supple.  Cardiovascular: Normal rate and regular rhythm.  Pulmonary/Chest: Effort normal and breath sounds normal. No stridor. No respiratory distress. She has no wheezes. She has no rales.  Musculoskeletal: Normal range of motion.  Neurological: She is alert and oriented to person, place, and time. No cranial nerve deficit.  CN II-XII in tact. Speech is clear. Alert to person, place and time. Normal coordination. Normal gait.   Skin: Skin is warm and dry. Capillary refill takes less than 2 seconds. No rash noted. She is not diaphoretic.  Psychiatric: She has a normal mood and affect. Her behavior is normal.  Nursing note and vitals reviewed.    UC Treatments / Results  Labs (all labs ordered are listed, but only abnormal results are displayed) Labs Reviewed - No data to display  EKG Date/Time: 09/14/2018   15:01:08 Ventricular Rate: 84 PR Interval: 138 QRS Duration: 82 QT Interval: 374 QTC Calculation: 441 P-R-T axes: 78   76   59 Text Interpretation: Normal sinus rhythm with sinus arrhythmia. Normal ECG     Radiology No results found.  Procedures Procedures (including critical care time)  Medications Ordered in UC Medications  0.9 %  sodium chloride infusion ( Intravenous Stopped 09/14/18 1611)    Initial Impression / Assessment and Plan / UC Course  I have reviewed the triage vital signs and the nursing notes.  Pertinent labs & imaging results that were available during my care of the patient were reviewed by me and considered in my medical decision making (see chart for  details).    Hx and exam c/w dehydration, likely from viral illness as well as possible vomiting from recent liquor consumption.  Pt was given IV fluids in UC States she feels much better. Orthostatic vitals improved after fluids. Encouraged continued fluids and rest at home.  Final Clinical Impressions(s) / UC Diagnoses   Final diagnoses:  Nausea and vomiting in adult  Tachycardia  Mild dehydration  Lightheadedness     Discharge Instructions      Please follow up with family medicine later this week if symptoms keep returning.  Please call 911 or go to the closest hospital if symptoms worsening.     ED Prescriptions    Medication Sig Dispense Auth. Provider   ondansetron (ZOFRAN ODT) 4 MG disintegrating tablet 4mg  ODT q4 hours prn nausea/vomit 6 tablet Noe Gens, PA-C     Controlled Substance Prescriptions [place] Controlled Substance Registry consulted? Not Applicable   Tyrell Antonio 09/15/18 1138

## 2018-10-31 IMAGING — US ULTRASOUND RIGHT BREAST LIMITED
1 series · 10 of 10 positions shown · non-contrast
Comparison: Bilateral breast ultrasound 08/13/2015, 07/22/2014,
12/15/2013, 06/18/2013.

CLINICAL DATA: 20-year-old with palpable masses in both breasts. 2
years of sonographic stability was demonstrated for masses in both
breasts in 5239, though the patient recently saw a new provider, and
the patient has a new palpable lump in the upper outer quadrant of
the left breast near the axillary tail.

EXAM:
LIMITED ULTRASOUND OF THE BILATERAL BREASTS

[Series 1: ultrasound right breast limited · 0.03mm/px · 10 of 10 slices shown]
[im 1/10]
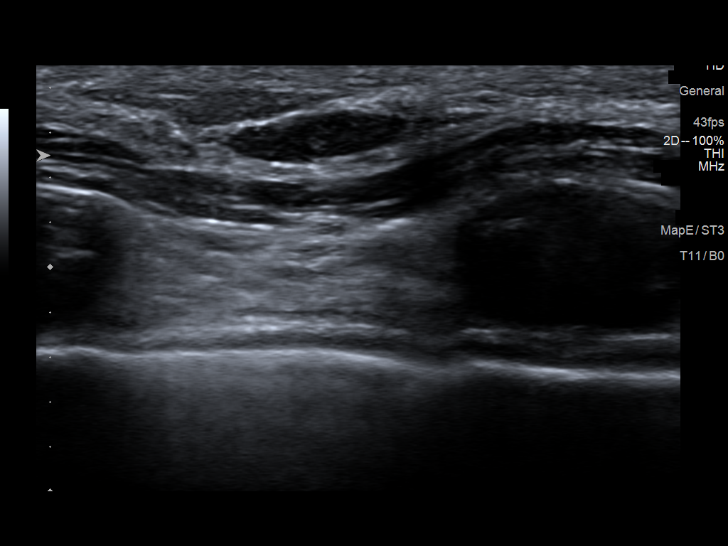
[im 2/10]
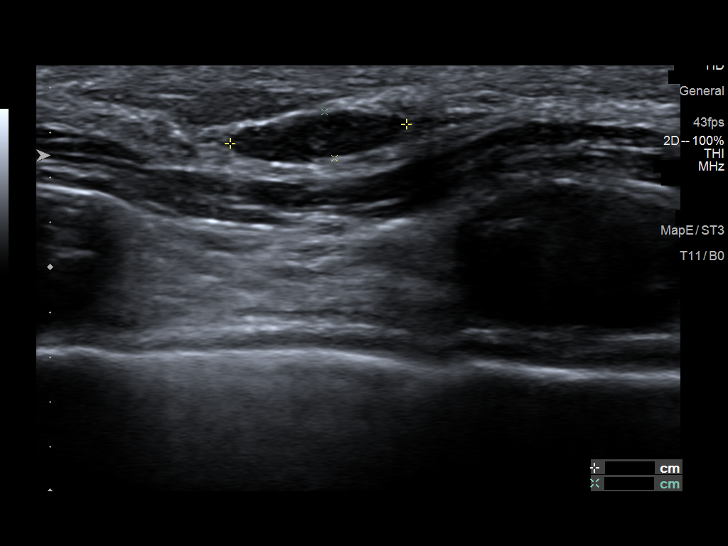
[im 3/10]
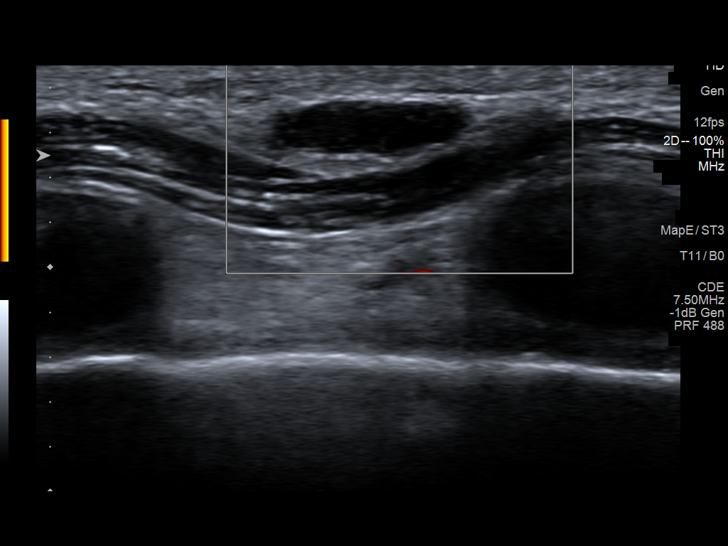
[im 4/10]
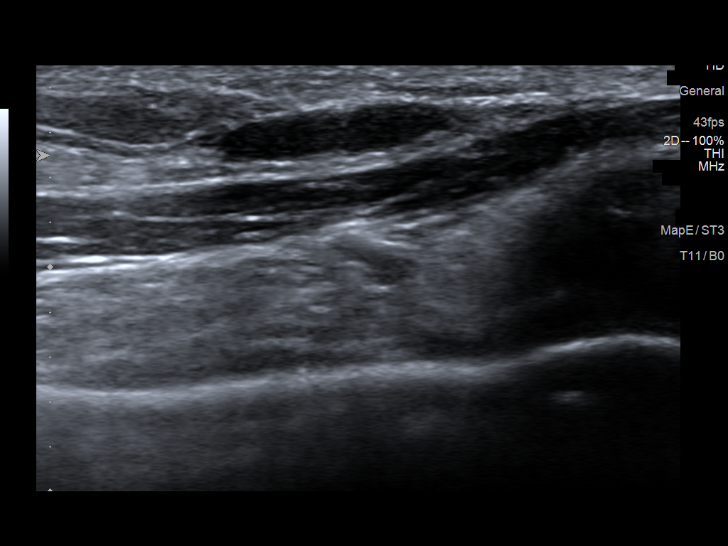
[im 5/10]
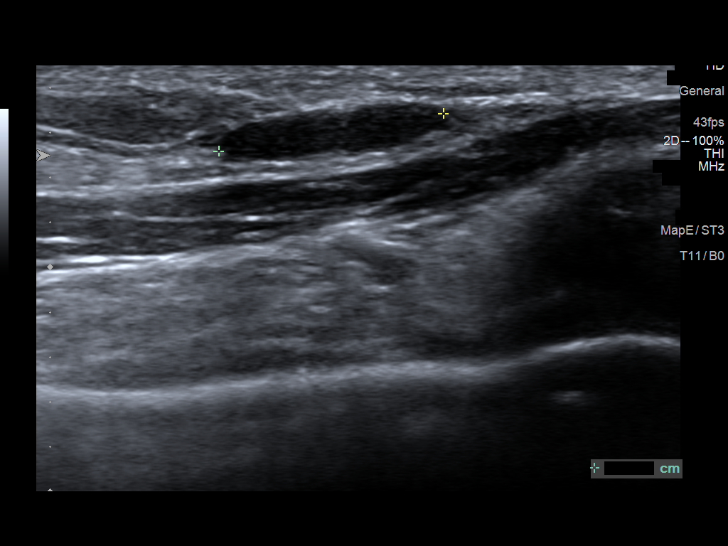
[im 6/10]
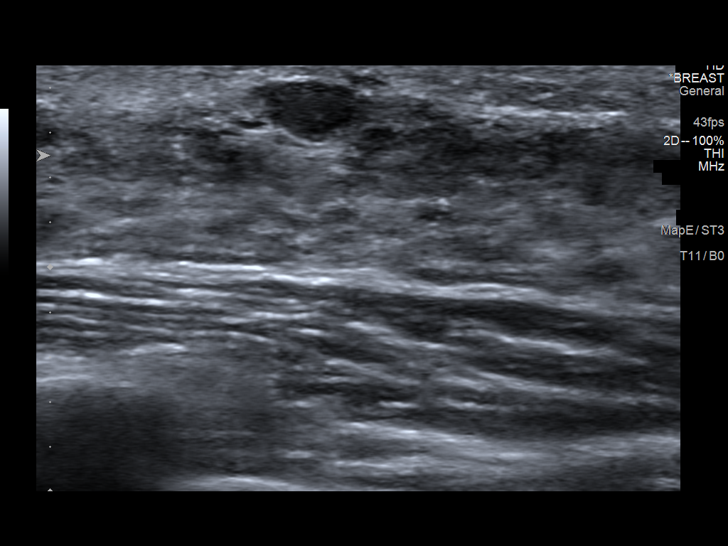
[im 7/10]
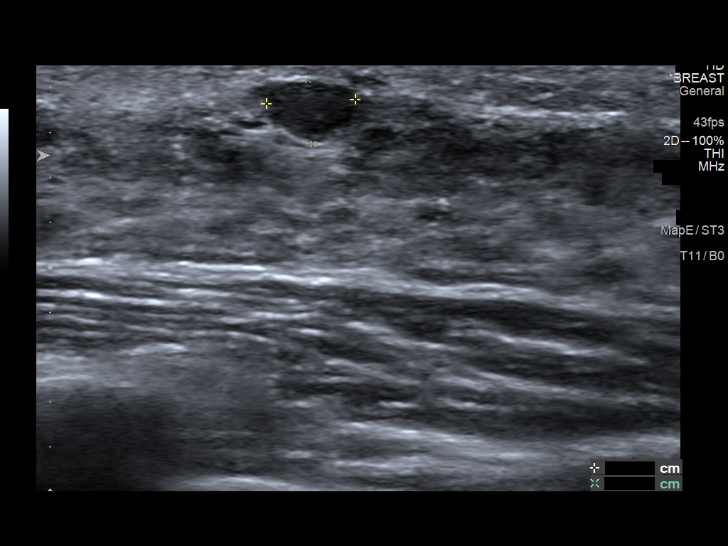
[im 8/10]
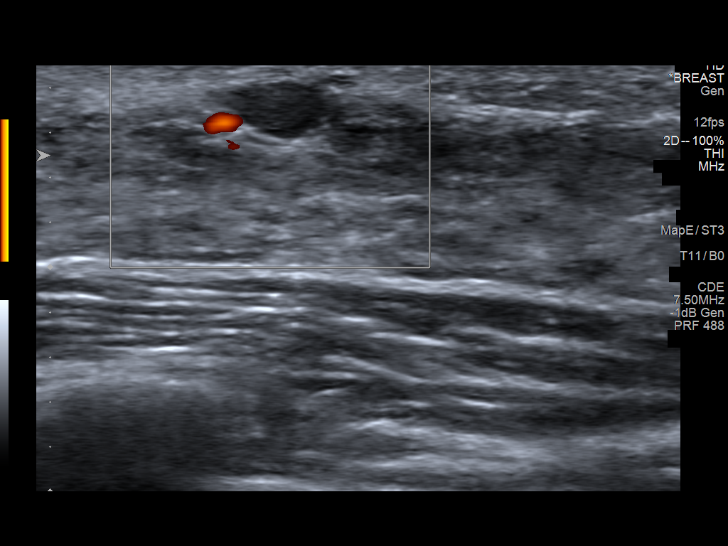
[im 9/10]
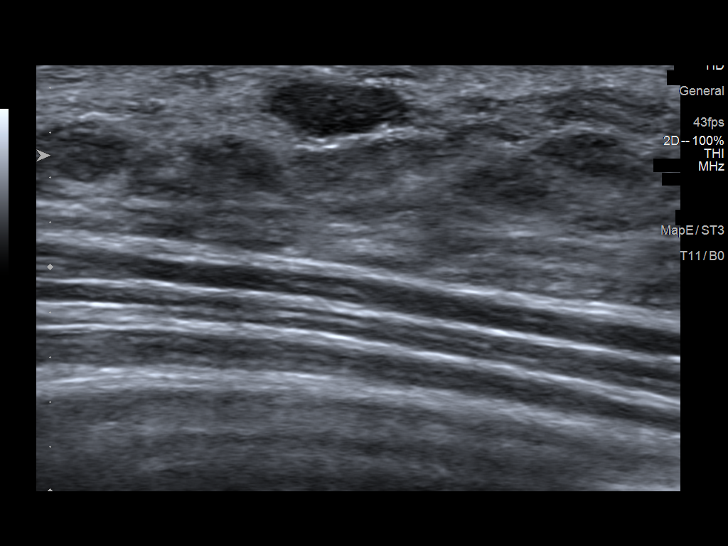
[im 10/10]
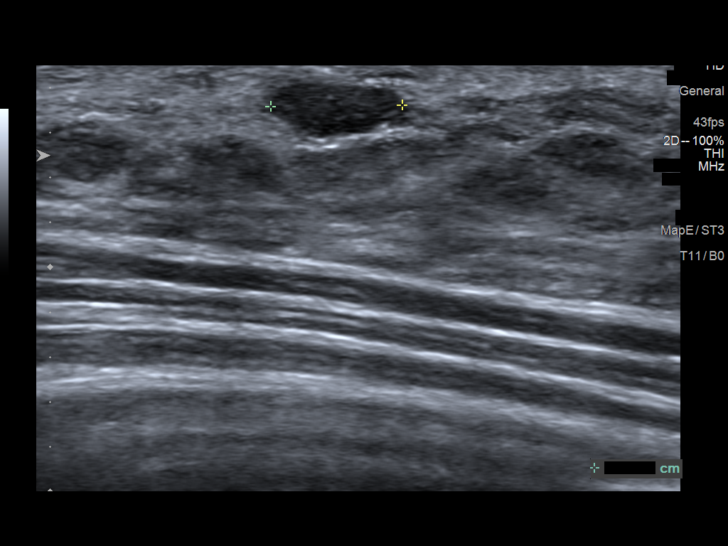

[10 of 10 positions shown; findings below may reference images not displayed]

FINDINGS: On physical exam, there is a palpable approximate 1-2 cm mass in the
upper outer quadrant of the left breast near the axillary tail
corresponding to the new palpable finding.

Targeted right breast ultrasound is performed, showing the
previously identified likely benign fibroadenomas. The oval
circumscribed parallel hypoechoic mass at the 3 o'clock position
approximately 7 cm from the nipple measures approximately 0.2 x
x 1.0 cm, unchanged when compared to the July 2015 ultrasound.
The oval circumscribed parallel hypoechoic mass at the 10 o'clock
position approximately 3 cm from the nipple measures approximately
0.3 x 0.4 x 0.6 cm, also unchanged. No new suspicious solid mass or
abnormal acoustic shadowing is identified in the right breast.

Targeted left breast ultrasound is performed, showing an oval
circumscribed parallel hypoechoic mass at the 1 o'clock position
approximately 4 cm from the nipple, measuring approximately 0.9 x
1.5 x 2.1 cm, demonstrating posterior acoustic enhancement and
demonstrating internal power Doppler flow, corresponding to the new
palpable lump. This has a similar appearance to the previously
identified likely benign fibroadenomas.

The previously identified oval circumscribed parallel hypoechoic
mass at the 9 o'clock position approximately 3 cm from the nipple
measures approximately 0.9 x 2.4 x 2.6 cm, unchanged when compared
to the July 2015 ultrasound. The oval circumscribed parallel
hypoechoic mass at the 2 o'clock position approximately 3 cm from
the nipple measures approximately 0.3 x 1.2 x 1.3 cm, slightly
decreased in size since the prior ultrasound.

Adjacent anechoic masses are identified at the 8 o'clock position
approximately 2 cm from the nipple, each demonstrating posterior
acoustic enhancement, the larger measuring approximately 0.8 x 1.4 x
1.5 cm.

No suspicious solid mass or abnormal acoustic shadowing is
identified in the left breast.
IMPRESSION: 1. Approximate 2.1 cm likely benign fibroadenoma involving the upper
outer quadrant of the left breast at the 1 o'clock position
approximately 4 cm from the nipple, accounting for a new area of
palpable concern.
2. Stable benign fibroadenomas at the 9 o'clock position of the left
breast and at the 2 o'clock position of the left breast dating back
to 7417.
3. Adjacent benign simple cysts in the lower outer quadrant of the
left breast at the 8 o'clock position.
4. Stable benign fibroadenomas at the 3 o'clock position of the
right breast and at the 10 o'clock position of the right breast
dating back to 7417.

RECOMMENDATION:
Left breast ultrasound in 6 months to confirm stability of the new
likely benign fibroadenoma in the upper outer quadrant at the 1
o'clock position approximately 4 cm from the nipple.

I have discussed the findings and recommendations with the patient.
Results were also provided in writing at the conclusion of the
visit. If applicable, a reminder letter will be sent to the patient
regarding the next appointment.

BI-RADS CATEGORY  3: Probably benign.

## 2018-11-14 ENCOUNTER — Ambulatory Visit: Payer: BLUE CROSS/BLUE SHIELD | Admitting: Internal Medicine

## 2018-11-19 ENCOUNTER — Encounter: Payer: Self-pay | Admitting: Internal Medicine

## 2018-11-19 ENCOUNTER — Ambulatory Visit (INDEPENDENT_AMBULATORY_CARE_PROVIDER_SITE_OTHER): Payer: BLUE CROSS/BLUE SHIELD | Admitting: Internal Medicine

## 2018-11-19 VITALS — BP 102/66 | HR 72 | Temp 98.1°F | Ht 66.2 in | Wt 124.6 lb

## 2018-11-19 DIAGNOSIS — M545 Low back pain: Secondary | ICD-10-CM | POA: Diagnosis not present

## 2018-11-19 DIAGNOSIS — M4134 Thoracogenic scoliosis, thoracic region: Secondary | ICD-10-CM | POA: Diagnosis not present

## 2018-11-19 DIAGNOSIS — G8929 Other chronic pain: Secondary | ICD-10-CM

## 2018-11-19 DIAGNOSIS — M546 Pain in thoracic spine: Secondary | ICD-10-CM

## 2018-11-19 MED ORDER — TIZANIDINE HCL 4 MG PO TABS: 4.0000 mg | ORAL_TABLET | Freq: Three times a day (TID) | ORAL | 0 refills | Status: DC

## 2018-11-19 NOTE — Patient Instructions (Signed)
Muscle Cramps and Spasms Muscle cramps and spasms are when muscles tighten by themselves. They usually get better within minutes. Muscle cramps are painful. They are usually stronger and last longer than muscle spasms. Muscle spasms may or may not be painful. They can last a few seconds or much longer. Follow these instructions at home:  Drink enough fluid to keep your pee (urine) clear or pale yellow.  Massage, stretch, and relax the muscle.  If directed, apply heat to tight or tense muscles as often as told by your doctor. Use the heat source that your doctor recommends. ? Place a towel between your skin and the heat source. ? Leave the heat on for 20-30 minutes. ? Take off the heat if your skin turns bright red. This is especially important if you are unable to feel pain, heat, or cold. You may have a greater risk of getting burned.  If directed, put ice on the affected area. This may help if you are sore or have pain after a cramp or spasm. ? Put ice in a plastic bag. ? Place a towel between your skin and the bag. ? Leave the ice on for 20 minutes, 2-3 times a day.  Take over-the-counter and prescription medicines only as told by your doctor.  Pay attention to any changes in your symptoms. Contact a doctor if:  Your cramps or spasms get worse or happen more often.  Your cramps or spasms do not get better with time. This information is not intended to replace advice given to you by your health care provider. Make sure you discuss any questions you have with your health care provider. Document Released: 11/23/2008 Document Revised: 01/12/2016 Document Reviewed: 09/14/2015 Elsevier Interactive Patient Education  2018 [place]. Back Pain, Adult Back pain is very common. The pain often gets better over time. The cause of back pain is usually not dangerous. Most people can learn to manage their back pain on their own. Follow these instructions at home: Watch your back pain for any  changes. The following actions may help to lessen any pain you are feeling:  Stay active. Start with short walks on flat ground if you can. Try to walk farther each day.  Exercise regularly as told by your doctor. Exercise helps your back heal faster. It also helps avoid future injury by keeping your muscles strong and flexible.  Do not sit, drive, or stand in one place for more than 30 minutes.  Do not stay in bed. Resting more than 1-2 days can slow down your recovery.  Be careful when you bend or lift an object. Use good form when lifting: ? Bend at your knees. ? Keep the object close to your body. ? Do not twist.  Sleep on a firm mattress. Lie on your side, and bend your knees. If you lie on your back, put a pillow under your knees.  Take medicines only as told by your doctor.  Put ice on the injured area. ? Put ice in a plastic bag. ? Place a towel between your skin and the bag. ? Leave the ice on for 20 minutes, 2-3 times a day for the first 2-3 days. After that, you can switch between ice and heat packs.  Avoid feeling anxious or stressed. Find good ways to deal with stress, such as exercise.  Maintain a healthy weight. Extra weight puts stress on your back.  Contact a doctor if:  You have pain that does not go away with rest  or medicine.  You have worsening pain that goes down into your legs or buttocks.  You have pain that does not get better in one week.  You have pain at night.  You lose weight.  You have a fever or chills. Get help right away if:  You cannot control when you poop (bowel movement) or pee (urinate).  Your arms or legs feel weak.  Your arms or legs lose feeling (numbness).  You feel sick to your stomach (nauseous) or throw up (vomit).  You have belly (abdominal) pain.  You feel like you may pass out (faint). This information is not intended to replace advice given to you by your health care provider. Make sure you discuss any questions  you have with your health care provider. Document Released: 05/29/2008 Document Revised: 05/18/2016 Document Reviewed: 04/14/2014 Elsevier Interactive Patient Education  [employer].

## 2018-11-19 NOTE — Progress Notes (Signed)
Subjective:     Patient ID: April Osborne , female    DOB: 06-26-1996 , 22 y.o.   MRN: [id number]   Chief Complaint  Patient presents with  . New Patient (Initial Visit)  . Back Pain    between shoulder blades for the past 6 months     HPI Has been having intermittent upper back pain off and on since May this year. Pain is provoked by reading and being hunched over. Pain is alleviated with muscle rub and pain patches. Heat has helped while using. She has never been to a Restaurant manager, fast food.  Denies pain radiating to arms or leg.    Lower back pain x since diagnosis of scoliosis, but not as often since after she did PT.  For lower back pain bending legs towards chest   Past Medical History:  Diagnosis Date  . Eosinophilic esophagitis   . H/O seasonal allergies   . Kidney stones      Family History  Problem Relation Age of Onset  . Hypertension Mother   . Hypertension Father   . Cancer Paternal Grandmother   . Diabetes Maternal Grandfather   . Dementia Maternal Grandfather      Current Outpatient Medications:  .  cetirizine (ZYRTEC) 10 MG tablet, Take 10 mg by mouth daily., Disp: , Rfl:  .  fluticasone (FLONASE) 50 MCG/ACT nasal spray, Spray 1-2 times in each nostril at bedtime., Disp: 16 g, Rfl: 5 .  JUNEL FE 1.5/30 1.5-30 MG-MCG tablet, Take 1 tablet by mouth daily., Disp: , Rfl: 2 .  omeprazole (PRILOSEC) 40 MG capsule, Take 40 mg by mouth daily., Disp: , Rfl:  .  OVER THE COUNTER MEDICATION, daily., Disp: , Rfl:    No Known Allergies   Review of Systems  HENT: Positive for postnasal drip.   Respiratory: Positive for cough.        Mild cough from PND  Musculoskeletal: Positive for back pain. Negative for arthralgias and gait problem.  Skin: Negative for rash.  Neurological: Negative for weakness and numbness.     Today's Vitals   11/19/18 1431  BP: 102/66  Pulse: 72  Temp: 98.1 F (36.7 C)  TempSrc: Oral  SpO2: 98%  Weight: 124 lb 9.6 oz (56.5 kg)  Height:  5' 6.2" (1.681 m)   Body mass index is 19.99 kg/m.   Objective:  Physical Exam  Constitutional: She is oriented to person, place, and time. She appears well-developed and well-nourished. No distress.  HENT:  Head: Normocephalic.  Right Ear: External ear normal.  Left Ear: External ear normal.  Nose: Nose normal.  Eyes: Conjunctivae are normal. Right eye exhibits no discharge. Left eye exhibits no discharge. No scleral icterus.  Neck: Neck supple.  Cardiovascular: Normal rate and regular rhythm.  No murmur heard. Pulmonary/Chest: Effort normal and breath sounds normal. No respiratory distress.  Musculoskeletal: Normal range of motion.  BACK- has mild mid thoracic scoliosis. Upper back with local tenderness on R mid trap, and both rhomboids. There is mild tense muscles here, the upper traps feel more tight.   Neurological: She is alert and oriented to person, place, and time. She displays normal reflexes. She exhibits normal muscle tone. Coordination normal.  Skin: Skin is warm and dry. No rash noted. She is not diaphoretic. No erythema.  Psychiatric: She has a normal mood and affect. Her behavior is normal. Judgment and thought content normal.  Nursing note and vitals reviewed.       Assessment And Plan:  1. Chronic bilateral thoracic back pain- possibly from posture, but also could be misalignment from looking down all the time and scoliosis.  - Ambulatory referral to Chiropractic  2. Chronic bilateral low back pain, unspecified whether sciatica present- chronic and intermittent.  With her agreement I will have her see Chiropractice care and FU in 1 month. We may ned to send her to PT after that. I prescribed her Zanaflex as noted prn.  May explain she may use ice when she has acute pains or areas of discomfort for 15 to 20 minutes 2-4 times a day for 48 hours and then may alternate ice and heat.  After that he may do some stretches which I showed her for her upper back  region.   SYLVIA RODRIGUEZ-SOUTHWORTH, PA-C

## 2018-12-12 ENCOUNTER — Ambulatory Visit (INDEPENDENT_AMBULATORY_CARE_PROVIDER_SITE_OTHER): Payer: BLUE CROSS/BLUE SHIELD | Admitting: Internal Medicine

## 2018-12-12 ENCOUNTER — Encounter: Payer: Self-pay | Admitting: Internal Medicine

## 2018-12-12 VITALS — BP 110/68 | HR 81 | Temp 98.6°F | Ht 66.2 in | Wt 123.4 lb

## 2018-12-12 DIAGNOSIS — G8929 Other chronic pain: Secondary | ICD-10-CM

## 2018-12-12 DIAGNOSIS — M546 Pain in thoracic spine: Secondary | ICD-10-CM

## 2018-12-12 DIAGNOSIS — Z09 Encounter for follow-up examination after completed treatment for conditions other than malignant neoplasm: Secondary | ICD-10-CM

## 2018-12-12 DIAGNOSIS — E559 Vitamin D deficiency, unspecified: Secondary | ICD-10-CM

## 2018-12-12 DIAGNOSIS — M542 Cervicalgia: Secondary | ICD-10-CM

## 2018-12-12 DIAGNOSIS — F419 Anxiety disorder, unspecified: Secondary | ICD-10-CM

## 2018-12-12 DIAGNOSIS — J069 Acute upper respiratory infection, unspecified: Secondary | ICD-10-CM

## 2018-12-12 NOTE — Patient Instructions (Addendum)
Use saline rinses like Natie Pot twice a day for 3-5 days. Watch the instruction on [employer]  I sent physical therapy order.

## 2018-12-12 NOTE — Progress Notes (Signed)
Subjective:     Patient ID: April Osborne , female    DOB: 03-16-1996 , 22 y.o.   MRN: [id number]   Chief Complaint  Patient presents with  . Back Pain    following up     HPI 1- Here for FU back pain and other things she wants addressed.  She has not gone to chiropractor since  Her insurance does not cover. Mid thorax pain is unchanged.  But the upper and lower back spasms are helped with the muscle relaxer for 6 hr. Has been using ice and heat and alternation and stretches helps  2- itchy throat and little PND x 2 dasys, no fever  3- Anxiety feeling off and on for the past month.She feels that going to school was a lot of it playing a part of it, and would get overwhelmed in doing too many thing. Since then she is trying to be intentional to remain calm and do one things at a time. Does not seem as bad since she is on school brake.   Past Medical History:  Diagnosis Date  . Eosinophilic esophagitis   . H/O seasonal allergies   . Kidney stones      Family History  Problem Relation Age of Onset  . Hypertension Mother   . Hypertension Father   . Cancer Paternal Grandmother   . Diabetes Maternal Grandfather   . Dementia Maternal Grandfather      Current Outpatient Medications:  .  cetirizine (ZYRTEC) 10 MG tablet, Take 10 mg by mouth daily., Disp: , Rfl:  .  fluticasone (FLONASE) 50 MCG/ACT nasal spray, Spray 1-2 times in each nostril at bedtime., Disp: 16 g, Rfl: 5 .  JUNEL FE 1.5/30 1.5-30 MG-MCG tablet, Take 1 tablet by mouth daily., Disp: , Rfl: 2 .  omeprazole (PRILOSEC) 40 MG capsule, Take 40 mg by mouth daily., Disp: , Rfl:  .  OVER THE COUNTER MEDICATION, daily., Disp: , Rfl:  .  tiZANidine (ZANAFLEX) 4 MG tablet, Take 1 tablet (4 mg total) by mouth 3 (three) times daily., Disp: 30 tablet, Rfl: 0   No Known Allergies   Review of Systems  HENT: Rhinorrhea: tsh.   Respiratory: Positive for cough. Negative for wheezing.   Genitourinary: Vaginal bleeding:     Psychiatric/Behavioral: The patient is nervous/anxious.        Has been stressed about insurance and school, dog died.     Today's Vitals   01/06/2019 1526  BP: 110/68  Pulse: 81  Temp: 98.6 F (37 C)  TempSrc: Oral  SpO2: 97%  Weight: 123 lb 6.4 oz (56 kg)  Height: 5' 6.2" (1.681 m)   Body mass index is 19.8 kg/m.   Objective:  Physical Exam    Constitutional: She is oriented to person, place, and time. She appears well-developed and well-nourished. No distress.  HENT: normal Head: Normocephalic and atraumatic.  Right Ear: External ear normal. Both TM's gray and shiny Left Ear: External ear normal. Both TM's gray and shiny Nose: Nose normal.  Eyes: Conjunctivae are normal. Right eye exhibits no discharge. Left eye exhibits no discharge. No scleral icterus.  Neck: Neck supple. No thyromegaly present. Trapezius do not seen as tight and tense as last time I examined her.  Cardiovascular: Normal rate and regular rhythm. No murmur heard. Pulmonary/Chest: Effort normal and breath sounds normal. No respiratory distress.  Musculoskeletal: Normal range of motion. She exhibits no edema.  Lymphadenopathy: She has no cervical adenopathy.  Neurological: She is alert  and oriented to person, place, and time.  Skin: Skin is warm and dry. Capillary refill takes less than 2 seconds. No rash noted. She is not diaphoretic.  BACK- has mild mid thoracic scoliosis. Upper back with mild tenderness on R mid trap, and both rhomboids. R mid thoracic tenderness noted.   Psychiatric: She has a normal mood and affect. Her behavior is normal. Judgment and thought content normal. Nursing note reviewed. Assessment And Plan:    1. Chronic bilateral thoracic back pain - Ambulatory referral to Physical Therapy  2. Neck pain- improved a little - Ambulatory referral to Physical Therapy  3. Vitamin D deficiency- past hx of this. Could be contributing to her anxiety if it is low.  - Vitamin D 1,25  Dihydroxy  4. Anxiety- new - TSH - T3, free - T4, Free  5. Viral upper respiratory tract infection- she is getting early cold. May take OTC meds as needed.   We will inform her of the results when back.     Kalii Chesmore RODRIGUEZ-SOUTHWORTH, PA-C

## 2018-12-13 LAB — T3, FREE: T3, Free: 3.7 pg/mL (ref 2.0–4.4)

## 2018-12-13 LAB — TSH: TSH: 0.776 u[IU]/mL (ref 0.450–4.500)

## 2018-12-13 LAB — T4, FREE: FREE T4: 1.45 ng/dL (ref 0.82–1.77)

## 2018-12-13 LAB — VITAMIN D 25 HYDROXY (VIT D DEFICIENCY, FRACTURES): Vit D, 25-Hydroxy: 11.3 ng/mL — ABNORMAL LOW (ref 30.0–100.0)

## 2018-12-14 ENCOUNTER — Other Ambulatory Visit: Payer: Self-pay | Admitting: Internal Medicine

## 2018-12-14 MED ORDER — VITAMIN D (ERGOCALCIFEROL) 1.25 MG (50000 UNIT) PO CAPS: ORAL_CAPSULE | ORAL | 0 refills | Status: AC

## 2019-01-09 ENCOUNTER — Encounter: Payer: Self-pay | Admitting: Rehabilitative and Restorative Service Providers"

## 2019-01-09 ENCOUNTER — Ambulatory Visit (INDEPENDENT_AMBULATORY_CARE_PROVIDER_SITE_OTHER): Payer: BLUE CROSS/BLUE SHIELD | Admitting: Rehabilitative and Restorative Service Providers"

## 2019-01-09 ENCOUNTER — Other Ambulatory Visit: Payer: Self-pay

## 2019-01-09 DIAGNOSIS — G8929 Other chronic pain: Secondary | ICD-10-CM

## 2019-01-09 DIAGNOSIS — R29898 Other symptoms and signs involving the musculoskeletal system: Secondary | ICD-10-CM | POA: Diagnosis not present

## 2019-01-09 DIAGNOSIS — M545 Low back pain: Secondary | ICD-10-CM

## 2019-01-09 DIAGNOSIS — M546 Pain in thoracic spine: Secondary | ICD-10-CM

## 2019-01-09 NOTE — Therapy (Signed)
[place], [place], [address] Phone: [phone]   Fax:  [phone]  Physical Therapy Evaluation  Patient Details  Name: April Osborne MRN: [id number] Date of Birth: 07/12/1996 Referring Provider (PT): Rodman Comp PA-C    Encounter Date: 01/09/2019  PT End of Session - 01/09/19 1709    Visit Number  1    Number of Visits  12    Date for PT Re-Evaluation  02/20/19    PT Start Time  4562    PT Stop Time  1757    PT Time Calculation (min)  53 min    Activity Tolerance  Patient tolerated treatment well       Past Medical History:  Diagnosis Date  . Eosinophilic esophagitis   . H/O seasonal allergies   . Kidney stones     History reviewed. No pertinent surgical history.  There were no vitals filed for this visit.   Subjective Assessment - 01/09/19 1710    Subjective  Symptoms of bilat mid to low back pain reported over the past 7 months which she atributes to stress of school and finding a job. Pain is present on an intermittent basis.     Pertinent History  LBP in middle school which was treated with PT with resolution     Patient Stated Goals  learn stretches to ease the pain; identify causes of pain to prevent pain     Currently in Pain?  Yes    Pain Score  2     Pain Location  Back    Pain Orientation  Right;Left;Lower    Pain Descriptors / Indicators  Aching    Pain Type  Chronic pain    Pain Onset  More than a month ago    Pain Frequency  Intermittent    Aggravating Factors   lifting; prolonged walking more than 1-2 hours     Pain Relieving Factors  meds; lying on back with knees up          [facility] PT Assessment - 01/09/19 0001      Assessment   Medical Diagnosis  Chronic thoracic and lumbar pain - bilaterally     Referring Provider (PT)  Sunday Spillers Rodrigues-Southworth PA-C     Onset Date/Surgical Date  05/25/18    Hand Dominance  Right    Next MD Visit  PRN      Prior Therapy  in middle school - here for LBP       Precautions   Precautions  None      Balance Screen   Has the patient fallen in the past 6 months  No    Has the patient had a decrease in activity level because of a fear of falling?   No    Is the patient reluctant to leave their home because of a fear of falling?   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    Leisure  sedentary lifestyle       Observation/Other Assessments   Focus on Therapeutic Outcomes (FOTO)   3% limitation       Sensation   Additional Comments  WNL's       Posture/Postural Control   Posture Comments  scoliosis; incresaed lumbar lordosis; scapulae abducted and winged along thoracic wall  AROM   Lumbar Flexion  90%    Lumbar Extension  50% discomfort lower thoracic     Lumbar - Right Side Bend  90%     Lumbar - Left Side Bend  90%    Lumbar - Right Rotation  75%    Lumbar - Left Rotation  75%      Strength   Overall Strength Comments  WFl's LE's       Palpation   Spinal mobility  tenderness with CPA mobs through the lumbar and thoracic spine     Palpation comment  muscular tightness through the thoracic and lumbar paraspinals                 Objective measurements completed on examination: See above findings.      [facility] Adult PT Treatment/Exercise - 01/09/19 0001      Lumbar Exercises: Stretches   Quadruped Mid Back Stretch  3 reps;20 seconds   child's pose      Lumbar Exercises: Supine   Other Supine Lumbar Exercises  4 part core 10 sec x 10 hooklying position       Lumbar Exercises: Quadruped   Madcat/Old Horse  5 reps      Shoulder Exercises: Standing   Other Standing Exercises  scap squeeze 10 sec x 10 reps; l's x 10; W's x 10 with noodle       Moist Heat Therapy   Number Minutes Moist Heat  12 Minutes    Moist Heat Location  Lumbar Spine       Electrical Stimulation   Electrical Stimulation Location  bilat upper lumbar to lower thoracic paraspinals     Electrical Stimulation Action  IFC    Electrical Stimulation Parameters  to tolerance    Electrical Stimulation Goals  Pain;Tone             PT Education - 01/09/19 1736    Education Details  HEP     Person(s) Educated  Patient    Methods  Explanation;Demonstration;Tactile cues;Verbal cues;Handout    Comprehension  Verbalized understanding;Returned demonstration;Verbal cues required;Tactile cues required          PT Long Term Goals - 01/09/19 1805      PT LONG TERM GOAL #1   Title  Increase spinal mobility with patient to demonstrate pain free full trunk ROM 02/20/2019    Time  6    Period  Weeks    Status  New      PT LONG TERM GOAL #2   Title  Improve core stability and strength with patient reporting ability to perform exercise for 20-30 min with no increase in pain 02/20/2019    Time  6    Period  Weeks    Status  New      PT LONG TERM GOAL #3   Title  Decrease pain by 75-100% with patient reporting normal functional activities without pain 02/20/2019    Time  6    Period  Weeks    Status  New      PT LONG TERM GOAL #4   Title  Independent in HEP with goal to transition patient to community based gym program 02/20/2019    Time  6    Period  Weeks    Status  New      PT LONG TERM GOAL #5   Title  maintain FOTO of 3% limitation 02/20/2019    Time  6    Period  Weeks  Status  New             Plan - 01/09/19 1755    Clinical Impression Statement  Patient presents with 6-7 month history of intermittent thoracolumbar muscular pain. She has limited mobility and muscular tightness through the thoracic and lumbar paraspinal musculature. Patient will benefit from PT to address problems identified.     History and Personal Factors relevant to plan of care:  history of LBP in middle school     Clinical Presentation  Stable    Clinical Decision  Making  Low    Rehab Potential  Good    Clinical Impairments Affecting Rehab Potential  sedentary lifestyle     PT Frequency  2x / week    PT Duration  6 weeks    PT Treatment/Interventions  Patient/family education;ADLs/Self Care Home Management;Cryotherapy;Electrical Stimulation;Iontophoresis 4mg /ml Dexamethasone;Moist Heat;Ultrasound;Dry needling;Manual techniques;Neuromuscular re-education;Functional mobility training;Therapeutic activities;Therapeutic exercise    PT Next Visit Plan  review HEP; progress with stretching and strengthening of thoracic and lumbar cores; modalities as indicated(has TENS unit at home)    Consulted and Agree with Plan of Care  Patient       Patient will benefit from skilled therapeutic intervention in order to improve the following deficits and impairments:  Postural dysfunction, Pain, Improper body mechanics, Increased fascial restricitons, Increased muscle spasms, Hypomobility, Decreased mobility, Decreased range of motion, Decreased activity tolerance  Visit Diagnosis: Pain in thoracic spine - Plan: PT plan of care cert/re-cert  Chronic bilateral low back pain without sciatica - Plan: PT plan of care cert/re-cert  Other symptoms and signs involving the musculoskeletal system - Plan: PT plan of care cert/re-cert     Problem List Patient Active Problem List   Diagnosis Date Noted  . 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 Eosinophilic esophagitis 78/24/2353  . Heart burn 07/17/2018  . Personal history of kidney stones 12/31/2014  . Acne 05/31/2013  . Allergic rhinitis 12/27/2012  . Benign breast lumps 12/27/2012  . Scoliosis 02/06/2012    Jesusmanuel Erbes Nilda Simmer PT, MPH  01/09/2019, 6:11 PM  [facility] [place], [place], [address] Phone: [phone]   Fax:  [phone]  Name: Kambryn Dapolito MRN: [id number] Date of Birth: 09-27-1996

## 2019-01-09 NOTE — Patient Instructions (Signed)
Thoracic Lift    Press shoulders down. Then lift mid-thoracic spine (area between the shoulder blades). Lift the breastbone slightly. Hold _5-10__ seconds. Relax. Repeat _10__ times.   Axial Extension (Chin Tuck)    Pull chin in and lengthen back of neck. Hold __5__ seconds while counting out loud. Repeat __10__ times. Do __several__ sessions per day.  Shoulder Blade Squeeze    Rotate shoulders back, then squeeze shoulder blades down and back . Hold 10 sec Repeat __10__ times. Do ____ sessions per day.  Upper Back Strength: Lower Trapezius / Rotator Cuff " L's "     Arms in waitress pose, palms up. Press hands back and slide shoulder blades down. Hold for __5__ seconds. Repeat _10___ times. 1-2 times per day.    Scapular Retraction: Elbow Flexion (Standing)  "W's"     With elbows bent to 90, pinch shoulder blades together and rotate arms out, keeping elbows bent. Repeat __10__ times per set. Do __1-2__ sets per session. Do _several ___ sessions per day.  Access Code: [email or web address]  URL: [email or web address]  Date: 01/09/2019  Prepared by: Gillermo Murdoch   Exercises  Cat Cow - 3 reps - 1 sets - 5 sec hold - 2x daily - 7x weekly  Cat-Camel to Child's Pose - 3 reps - 1 sets - 20 sec hold - 2x daily - 7x weekly  Hooklying Transversus Abdominis Palpation - 10 reps - 1 sets - 10 sec hold - 2x daily - 7x weekly     [facility] [place], [place] [address]  [phone] (office) [phone] (fax)

## 2019-01-16 ENCOUNTER — Encounter: Payer: Self-pay | Admitting: Physical Therapy

## 2019-01-16 ENCOUNTER — Ambulatory Visit (INDEPENDENT_AMBULATORY_CARE_PROVIDER_SITE_OTHER): Payer: BLUE CROSS/BLUE SHIELD | Admitting: Physical Therapy

## 2019-01-16 DIAGNOSIS — M546 Pain in thoracic spine: Secondary | ICD-10-CM | POA: Diagnosis not present

## 2019-01-16 DIAGNOSIS — M545 Low back pain, unspecified: Secondary | ICD-10-CM

## 2019-01-16 DIAGNOSIS — R29898 Other symptoms and signs involving the musculoskeletal system: Secondary | ICD-10-CM

## 2019-01-16 DIAGNOSIS — G8929 Other chronic pain: Secondary | ICD-10-CM

## 2019-01-16 NOTE — Therapy (Signed)
[place], [place], [address] Phone: [phone]   Fax:  [phone]  Physical Therapy Treatment  Patient Details  Name: April Osborne MRN: [id number] Date of Birth: 1996-09-27 Referring Provider (PT): Rodman Comp PA-C    Encounter Date: 01/16/2019  PT End of Session - 01/16/19 1659    Visit Number  2    Number of Visits  12    Date for PT Re-Evaluation  02/20/19    PT Start Time  1622   pt arrived late   PT Stop Time  1714    PT Time Calculation (min)  52 min       Past Medical History:  Diagnosis Date  . Eosinophilic esophagitis   . H/O seasonal allergies   . Kidney stones     History reviewed. No pertinent surgical history.  There were no vitals filed for this visit.  Subjective Assessment - 01/16/19 1757    Subjective  Pt reports her low back continues to bother her.  She was leaning over for an extended time supporting a patients back, at bedside.  She has been doing her "shoulder exercises".     Patient Stated Goals  learn stretches to ease the pain; identify causes of pain to prevent pain     Currently in Pain?  Yes    Pain Score  4     Pain Location  Back    Pain Orientation  Left;Right;Lower;Mid    Aggravating Factors   lifting, prolonged walking more than 1-2 hrs.     Pain Relieving Factors  heat, medication         [facility] PT Assessment - 01/16/19 0001      Assessment   Medical Diagnosis  Chronic thoracic and lumbar pain - bilaterally     Referring Provider (PT)  Sunday Spillers Rodrigues-Southworth PA-C     Onset Date/Surgical Date  05/25/18    Hand Dominance  Right    Next MD Visit  PRN     Prior Therapy  in middle school - here for LBP        [facility] Adult PT Treatment/Exercise - 01/16/19 0001      Exercises   Exercises  Shoulder;Lumbar      Lumbar Exercises: Stretches   Passive Hamstring Stretch  Right;Left;2 reps;20 seconds    Piriformis Stretch   Right;Left;1 rep;20 seconds   seated     Lumbar Exercises: Aerobic   UBE (Upper Arm Bike)  L2: 1.5 min each direction, standing on Bosu; for warm up.       Lumbar Exercises: Supine   Ab Set  10 reps;5 seconds    AB Set Limitations  tactile cues and rationale for use of ab set during work activities.       Lumbar Exercises: Quadruped   Madcat/Old Horse  5 reps    Other Quadruped Lumbar Exercises  childs pose x 2 reps with lateral flexion x 20 sec each direction, 2 sets       Shoulder Exercises: Standing   Other Standing Exercises  L's and W's with back against noodle x 5 sec hold x 10 reps each.       Shoulder Exercises: Stretch   Other Shoulder Stretches  3 position doorway stretch x 20 sec x 2 reps each position    Other Shoulder Stretches  lat stretch with arms on back of truck x 20 sec x 2 reps      Moist Heat Therapy  Number Minutes Moist Heat  15 Minutes    Moist Heat Location  Lumbar Spine      Electrical Stimulation   Electrical Stimulation Location  bilat upper lumbar to lower lumbar paraspinals     Electrical Stimulation Action  IFC    Electrical Stimulation Parameters  to tolerance    Electrical Stimulation Goals  Pain;Tone                  PT Long Term Goals - 01/09/19 1805      PT LONG TERM GOAL #1   Title  Increase spinal mobility with patient to demonstrate pain free full trunk ROM 02/20/2019    Time  6    Period  Weeks    Status  New      PT LONG TERM GOAL #2   Title  Improve core stability and strength with patient reporting ability to perform exercise for 20-30 min with no increase in pain 02/20/2019    Time  6    Period  Weeks    Status  New      PT LONG TERM GOAL #3   Title  Decrease pain by 75-100% with patient reporting normal functional activities without pain 02/20/2019    Time  6    Period  Weeks    Status  New      PT LONG TERM GOAL #4   Title  Independent in HEP with goal to transition patient to community based gym program  02/20/2019    Time  6    Period  Weeks    Status  New      PT LONG TERM GOAL #5   Title  maintain FOTO of 3% limitation 02/20/2019    Time  6    Period  Weeks    Status  New            Plan - 01/16/19 1748    Clinical Impression Statement  Pt tolerated all exercises well, reporting some reduction of LBP afterwards.  Further reduction of pain with MHP / estim at end of session.  Pt will benefit from education on body mechanics at work.  Goals are ongoing.      Rehab Potential  Good    Clinical Impairments Affecting Rehab Potential  sedentary lifestyle     PT Frequency  2x / week    PT Duration  6 weeks    PT Treatment/Interventions  Patient/family education;ADLs/Self Care Home Management;Cryotherapy;Electrical Stimulation;Iontophoresis 4mg /ml Dexamethasone;Moist Heat;Ultrasound;Dry needling;Manual techniques;Neuromuscular re-education;Functional mobility training;Therapeutic activities;Therapeutic exercise    PT Next Visit Plan   progress with stretching and strengthening of thoracic and lumbar, core.    PT Home Exercise Plan  Access Code: [email or web address]    Consulted and Agree with Plan of Care  Patient       Patient will benefit from skilled therapeutic intervention in order to improve the following deficits and impairments:  Postural dysfunction, Pain, Improper body mechanics, Increased fascial restricitons, Increased muscle spasms, Hypomobility, Decreased mobility, Decreased range of motion, Decreased activity tolerance  Visit Diagnosis: Pain in thoracic spine  Chronic bilateral low back pain without sciatica  Other symptoms and signs involving the musculoskeletal system     Problem List Patient Active Problem List   Diagnosis Date Noted  . 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 Eosinophilic esophagitis 30/16/0109  . Heart burn 07/17/2018  . Personal history of kidney stones 12/31/2014  . Acne 05/31/2013  . Allergic rhinitis 12/27/2012  . Benign breast lumps  12/27/2012  . Scoliosis 02/06/2012   Kerin Perna, PTA 01/16/19 5:59 PM  [place], [place], [address] Phone: [phone]   Fax:  [phone]  Name: Keiva Dina MRN: [id number] Date of Birth: Feb 15, 1996

## 2019-01-16 NOTE — Patient Instructions (Signed)
Access Code: [email or web address]  URL: [email or web address]  Date: 01/16/2019  Prepared by: Kerin Perna   Exercises  Seated Hamstring Stretch - 2-3 reps - 1 sets - 20 seconds hold - 1-2x daily - 7x weekly  Seated Piriformis Stretch - 2-3 reps - 1 sets - 20 seconds hold - 1-2x daily - 7x weekly  Step Back Shoulder Stretch with Chair - 2-3 reps - 1 sets - 20 seconds hold - 1-2x daily - 7x weekly  Seated Transversus Abdominis Bracing - 10 reps - 1 sets - 5-10 hold - 1x daily - 7x weekly   Added 3 position doorway stretch.

## 2019-01-23 ENCOUNTER — Encounter: Payer: Self-pay | Admitting: Rehabilitative and Restorative Service Providers"

## 2019-01-23 ENCOUNTER — Ambulatory Visit (INDEPENDENT_AMBULATORY_CARE_PROVIDER_SITE_OTHER): Payer: BLUE CROSS/BLUE SHIELD | Admitting: Rehabilitative and Restorative Service Providers"

## 2019-01-23 DIAGNOSIS — G8929 Other chronic pain: Secondary | ICD-10-CM

## 2019-01-23 DIAGNOSIS — R29898 Other symptoms and signs involving the musculoskeletal system: Secondary | ICD-10-CM | POA: Diagnosis not present

## 2019-01-23 DIAGNOSIS — M546 Pain in thoracic spine: Secondary | ICD-10-CM | POA: Diagnosis not present

## 2019-01-23 DIAGNOSIS — M545 Low back pain: Secondary | ICD-10-CM

## 2019-01-23 NOTE — Therapy (Signed)
[facility] [place], [place], [address] Phone: [phone]   Fax:  [phone]  Physical Therapy Treatment  Patient Details  Name: April Osborne MRN: [id number] Date of Birth: 1996-08-11 Referring Provider (PT): Rodman Comp PA-C    Encounter Date: 01/23/2019  PT End of Session - 01/23/19 1709    Visit Number  3    Number of Visits  12    Date for PT Re-Evaluation  02/20/19    PT Start Time  1708    PT Stop Time  1755    PT Time Calculation (min)  47 min    Activity Tolerance  Patient tolerated treatment well       Past Medical History:  Diagnosis Date  . Eosinophilic esophagitis   . H/O seasonal allergies   . Kidney stones     History reviewed. No pertinent surgical history.  There were no vitals filed for this visit.  Subjective Assessment - 01/23/19 1710    Subjective  Patient reports that she has no LBP today - having tightness and discomfort around the Rt shoulder blade area. Thinks of doing her exercises more when she is in pain than when she is not.     Currently in Pain?  Yes    Pain Score  4     Pain Location  Head    Pain Orientation  Right;Mid;Posterior    Pain Descriptors / Indicators  Aching    Pain Type  Chronic pain    Pain Onset  More than a month ago    Pain Frequency  Intermittent                       [facility] Adult PT Treatment/Exercise - 01/23/19 0001      Therapeutic Activites    Therapeutic Activities  --   myofacial ball release work standing ~ 4 inch plastic ball      Lumbar Exercises: Aerobic   UBE (Upper Arm Bike)  L2: 4 min fwd/back       Lumbar Exercises: Supine   Other Supine Lumbar Exercises  4 part core 10 sec x 10 hooklying position    verbal and taclite cues - decreased contraction intensity      Lumbar Exercises: Quadruped   Madcat/Old Horse  5 reps    Other Quadruped Lumbar Exercises  childs pose x 2 reps with lateral  flexion x 20 sec each direction, 2 sets       Shoulder Exercises: Standing   Extension  Strengthening;Both;15 reps;Theraband    Theraband Level (Shoulder Extension)  Level 2 (Red)    Row  Strengthening;Both;15 reps;Theraband    Theraband Level (Shoulder Row)  Level 2 (Red)    Retraction  Strengthening;Both;20 reps;Theraband    Theraband Level (Shoulder Retraction)  Level 1 (Yellow)    Other Standing Exercises  scap squeeze x 10 sec hold x 10; L's and W's with back against noodle x 5 sec hold x 10 reps each.       Shoulder Exercises: Stretch   Other Shoulder Stretches  3 position doorway stretch x 30 sec x 2 reps each position stretching arms together    Other Shoulder Stretches  lateral cervical flexion to each side 10 sec x 3 each side       Moist Heat Therapy   Number Minutes Moist Heat  15 Minutes    Moist Heat Location  Lumbar Spine   thoracic spine  Acupuncturist Location  Rt periscapular area     Electrical Stimulation Action  IFC     Electrical Stimulation Parameters  to tolerance    Electrical Stimulation Goals  Pain;Tone                  PT Long Term Goals - 01/09/19 1805      PT LONG TERM GOAL #1   Title  Increase spinal mobility with patient to demonstrate pain free full trunk ROM 02/20/2019    Time  6    Period  Weeks    Status  New      PT LONG TERM GOAL #2   Title  Improve core stability and strength with patient reporting ability to perform exercise for 20-30 min with no increase in pain 02/20/2019    Time  6    Period  Weeks    Status  New      PT LONG TERM GOAL #3   Title  Decrease pain by 75-100% with patient reporting normal functional activities without pain 02/20/2019    Time  6    Period  Weeks    Status  New      PT LONG TERM GOAL #4   Title  Independent in HEP with goal to transition patient to community based gym program 02/20/2019    Time  6    Period  Weeks    Status  New      PT LONG TERM  GOAL #5   Title  maintain FOTO of 3% limitation 02/20/2019    Time  6    Period  Weeks    Status  New            Plan - 01/23/19 1710    Clinical Impression Statement  Improved LBP - pt reports pain in the Rt periscapular area today. Discussed regular physical activity suggested pt begiin a regular walking program and check at the gym for classes she may be interested in. Added posterior shoulder girdle strengthening exercises today with light resistive bands. Will continue treatment including body mechanics for work. Pt will benefit from continued treatment to address problems identified.     Rehab Potential  Good    Clinical Impairments Affecting Rehab Potential  sedentary lifestyle     PT Frequency  2x / week    PT Duration  6 weeks    PT Treatment/Interventions  Patient/family education;ADLs/Self Care Home Management;Cryotherapy;Electrical Stimulation;Iontophoresis 4mg /ml Dexamethasone;Moist Heat;Ultrasound;Dry needling;Manual techniques;Neuromuscular re-education;Functional mobility training;Therapeutic activities;Therapeutic exercise    PT Next Visit Plan   progress with stretching and strengthening of thoracic and lumbar, core.    PT Home Exercise Plan  Access Code: [email or web address]    Consulted and Agree with Plan of Care  Patient       Patient will benefit from skilled therapeutic intervention in order to improve the following deficits and impairments:  Postural dysfunction, Pain, Improper body mechanics, Increased fascial restricitons, Increased muscle spasms, Hypomobility, Decreased mobility, Decreased range of motion, Decreased activity tolerance  Visit Diagnosis: Pain in thoracic spine  Chronic bilateral low back pain without sciatica  Other symptoms and signs involving the musculoskeletal system     Problem List Patient Active Problem List   Diagnosis Date Noted  . 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 Eosinophilic esophagitis 32/54/9826  . Heart burn 07/17/2018   . Personal history of kidney stones 12/31/2014  . Acne 05/31/2013  . Allergic rhinitis 12/27/2012  . Benign breast  lumps 12/27/2012  . Scoliosis 02/06/2012    April Osborne Nilda Simmer PT, MPH  01/23/2019, 5:51 PM  [facility] [place], [place], [address] Phone: [phone]   Fax:  [phone]  Name: April Osborne MRN: [id number] Date of Birth: Jul 15, 1996

## 2019-01-30 ENCOUNTER — Encounter: Payer: BLUE CROSS/BLUE SHIELD | Admitting: Physical Therapy

## 2019-02-06 ENCOUNTER — Ambulatory Visit (INDEPENDENT_AMBULATORY_CARE_PROVIDER_SITE_OTHER): Payer: BLUE CROSS/BLUE SHIELD | Admitting: Physical Therapy

## 2019-02-06 ENCOUNTER — Encounter: Payer: Self-pay | Admitting: Physical Therapy

## 2019-02-06 DIAGNOSIS — M546 Pain in thoracic spine: Secondary | ICD-10-CM

## 2019-02-06 DIAGNOSIS — M545 Low back pain: Secondary | ICD-10-CM | POA: Diagnosis not present

## 2019-02-06 DIAGNOSIS — G8929 Other chronic pain: Secondary | ICD-10-CM | POA: Diagnosis not present

## 2019-02-06 DIAGNOSIS — R29898 Other symptoms and signs involving the musculoskeletal system: Secondary | ICD-10-CM

## 2019-02-06 NOTE — Therapy (Addendum)
[facility] [place], [place], [address] Phone: [phone]   Fax:  [phone]  Physical Therapy Treatment  Patient Details  Name: April Osborne MRN: [id number] Date of Birth: 03-29-1996 Referring Provider (PT): Rodman Comp PA-C    Encounter Date: 02/06/2019  PT End of Session - 02/06/19 1707    Visit Number  4    Number of Visits  12    Date for PT Re-Evaluation  02/20/19    PT Start Time  4562    PT Stop Time  5638    PT Time Calculation (min)  43 min    Activity Tolerance  Patient tolerated treatment well;No increased pain    Behavior During Therapy  WFL for tasks assessed/performed       Past Medical History:  Diagnosis Date  . Eosinophilic esophagitis   . H/O seasonal allergies   . Kidney stones     History reviewed. No pertinent surgical history.  There were no vitals filed for this visit.  Subjective Assessment - 02/06/19 1707    Subjective  Pt reports her back pain has been up and down.  At the most, at worst 4/10, at best 0/10.  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     Patient Stated Goals  learn stretches to ease the pain; identify causes of pain to prevent pain     Currently in Pain?  No/denies    Pain Score  0-No pain         [facility] PT Assessment - 02/06/19 0001      Assessment   Medical Diagnosis  Chronic thoracic and lumbar pain - bilaterally     Referring Provider (PT)  Sunday Spillers Rodrigues-Southworth PA-C     Onset Date/Surgical Date  05/25/18    Hand Dominance  Right    Next MD Visit  PRN     Prior Therapy  in middle school - here for LBP       AROM   Lumbar Flexion  WNL     Lumbar Extension  WNL     Lumbar - Right Side Bend  WNL    Lumbar - Left Side Bend  WNL    Lumbar - Right Rotation  90%    Lumbar - Left Rotation   WNL       [facility] Adult PT Treatment/Exercise - 02/06/19 0001      Lumbar Exercises: Stretches   Passive Hamstring Stretch  Right;Left;2 reps;60 seconds   seated, straight back   Gastroc Stretch  Right;Left;2 reps;20 seconds   heel off step     Lumbar Exercises: Aerobic   Nustep  L5: arms/legs x 6 min       Lumbar Exercises: Sidelying   Other Sidelying Lumbar Exercises  open book, thoracic rotation without resistance x 5 reps, then 5 with red band, each side.       Lumbar Exercises: Quadruped   Madcat/Old Horse  5 reps    Opposite Arm/Leg Raise  Right arm/Left leg;Left arm/Right leg;10 reps    Other Quadruped Lumbar Exercises  childs pose x 2 reps with lateral flexion x 20 sec each direction, 2 sets       Shoulder Exercises: Prone   Other Prone Exercises  goal posts with axial ext and core engaged x 10 reps      Shoulder  Exercises: Standing   Extension  Strengthening;Both;Theraband;10 reps    Theraband Level (Shoulder Extension)  Level 2 (Red)    Row  Strengthening;Both;15 reps;Theraband    Theraband Level (Shoulder Row)  Level 2 (Red)      Shoulder Exercises: Stretch   Other Shoulder Stretches  3 position doorway stretch x 30 sec x 2 reps each position stretching arms together    Other Shoulder Stretches  rhomboid stretch in doorway x 15 sec x 2 reps      Modalities   Modalities  --   held; pain free            PT Education - 02/06/19 1743    Education Details  updated HEP    Person(s) Educated  Patient    Methods  Explanation;Handout;Verbal cues;Demonstration    Comprehension  Verbalized understanding;Returned demonstration          PT Long Term Goals - 02/06/19 1710      PT LONG TERM GOAL #1   Title  Increase spinal mobility with patient to demonstrate pain free full trunk ROM 02/20/2019    Time  6    Period  Weeks    Status  Achieved      PT LONG TERM GOAL #2   Title  Improve core stability and strength with patient reporting ability to perform  exercise for 20-30 min with no increase in pain 02/20/2019    Baseline  hasn't started walking program or gym program yet - 02/06/19.     Time  6    Period  Weeks    Status  On-going      PT LONG TERM GOAL #3   Title  Decrease pain by 75-100% with patient reporting normal functional activities without pain 02/20/2019    Baseline  50% decrease, reported 02/06/19    Time  6    Period  Weeks    Status  On-going      PT LONG TERM GOAL #4   Title  Independent in HEP with goal to transition patient to community based gym program 02/20/2019    Time  6    Period  Weeks    Status  On-going      PT LONG TERM GOAL #5   Title  maintain FOTO of 3% limitation 02/20/2019    Time  6    Period  Weeks    Status  On-going            Plan - 02/06/19 1733    Clinical Impression Statement  Pt demonstrated improved trunk ROM without pain; has met LTG #1.  Pt tolerated all exercises without increase in pain.  Pt is progressing well towards remaining goals. Pt mentioned change in work schedule and benefits/ may need to d/c soon.     Rehab Potential  Good    Clinical Impairments Affecting Rehab Potential  sedentary lifestyle     PT Frequency  2x / week    PT Duration  6 weeks    PT Treatment/Interventions  Patient/family education;ADLs/Self Care Home Management;Cryotherapy;Electrical Stimulation;Iontophoresis 56m/ml Dexamethasone;Moist Heat;Ultrasound;Dry needling;Manual techniques;Neuromuscular re-education;Functional mobility training;Therapeutic activities;Therapeutic exercise    PT Next Visit Plan  review HEP, add gym program.     PT Home Exercise Plan  Access Code: [email or web address]   Consulted and Agree with Plan of Care  Patient       Patient will benefit from skilled therapeutic intervention in order to improve the following deficits and impairments:  Postural dysfunction, Pain, Improper  body mechanics, Increased fascial restricitons, Increased muscle spasms, Hypomobility, Decreased mobility, Decreased  range of motion, Decreased activity tolerance  Visit Diagnosis: Pain in thoracic spine  Chronic bilateral low back pain without sciatica  Other symptoms and signs involving the musculoskeletal system     Problem List Patient Active Problem List   Diagnosis Date Noted  . 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 Eosinophilic esophagitis 40/98/1191  . Heart burn 07/17/2018  . Personal history of kidney stones 12/31/2014  . Acne 05/31/2013  . Allergic rhinitis 12/27/2012  . Benign breast lumps 12/27/2012  . Scoliosis 02/06/2012   Kerin Perna, PTA 02/06/19 5:54 PM  [facility] [place], [place], [address] Phone: [phone]   Fax:  [phone]  Name: April Osborne MRN: [id number] Date of Birth: [birth year]  PHYSICAL THERAPY DISCHARGE SUMMARY  Visits from Start of Care: 4  Current functional level related to goals / functional outcomes: Se progress note for discharge status    Remaining deficits: Unknown    Education / Equipment: HEP  Plan: Patient agrees to discharge.  Patient goals were partially met. Patient is being discharged due to                                                     ?????    Change in insurance coverage - [facility] now out of network. Encouraged Nakeesha to seek PT at a facility in her network for any continued problems.  Celyn P. Helene Kelp PT, MPH 02/20/19 9:42 AM

## 2019-02-06 NOTE — Patient Instructions (Signed)
Access Code: [email or web address]  URL: [email or web address]  Date: 02/06/2019  Prepared by: Kerin Perna   Exercises  Seated Hamstring Stretch - 2-3 reps - 1 sets - 20 seconds hold - 1-2x daily - 7x weekly  Seated Piriformis Stretch - 2-3 reps - 1 sets - 20 seconds hold - 1-2x daily - 7x weekly  Step Back Shoulder Stretch with Chair - 2-3 reps - 1 sets - 20 seconds hold - 1-2x daily - 7x weekly  Seated Transversus Abdominis Bracing - 10 reps - 1 sets - 5-10 hold - 1x daily - 7x weekly  Shoulder External Rotation and Scapular Retraction with Resistance - 10 reps - 3 sets - 2x daily - 7x weekly  Scapular Retraction with Resistance - 10 reps - 3 sets - 2x daily - 7x weekly  Scapular Retraction with Resistance Advanced - 10 reps - 3 sets - 2x daily - 7x weekly  Sidelying Mid Thoracic Rotation - 10 reps - 1 sets - 1x daily - 3x weekly  Bird Dog - 10 reps - 1 sets - 1x daily - 3x weekly

## 2019-02-20 ENCOUNTER — Encounter: Payer: BLUE CROSS/BLUE SHIELD | Admitting: Physical Therapy

## 2019-03-31 ENCOUNTER — Encounter: Payer: Self-pay | Admitting: Internal Medicine

## 2019-03-31 MED ORDER — FLUTICASONE PROPIONATE 50 MCG/ACT NA SUSP: NASAL | 1 refills | Status: DC

## 2019-03-31 MED ORDER — TIZANIDINE HCL 4 MG PO TABS: 4.0000 mg | ORAL_TABLET | Freq: Three times a day (TID) | ORAL | 0 refills | Status: AC

## 2019-03-31 MED ORDER — CETIRIZINE HCL 10 MG PO TABS: 10.0000 mg | ORAL_TABLET | Freq: Every day | ORAL | 1 refills | Status: AC

## 2019-05-20 ENCOUNTER — Telehealth: Payer: Self-pay | Admitting: Internal Medicine

## 2019-05-20 NOTE — Telephone Encounter (Signed)
PT MOTHER CALLED IN STATED THAT ON 11/26 PT CAME TO OFFICE TO ESTABLISH CARE AND GET PHYSICAL SHE DID NOT RECV PHYSICAL AT FIRST APPT PT RTND 4 WEEKS AND STILL DID NOT RECV PHYSICAL ACCORDING TO NOTES, PT MOTHER CONCERNED ABOUT BILL BECAUSE AT ONE OF THOSE APPTS PT SHOULD HAVE RECVD PHYSICAL. ADV I WILL HAVE TO CONTACT PROVIDER NOT SURE WHAT HAPPENED AT EITHER VISIT

## 2019-05-26 ENCOUNTER — Telehealth: Payer: Self-pay

## 2019-05-26 NOTE — Telephone Encounter (Signed)
SPOKE WITH PT MOTHER ABOUT CONCERNS OF PHYSICAL NOT BEING PERFORMED AT VISIT. INFORMED BARBARA Bontempo THAT WE WOULD REACH OUT TO THE BILLING DEPARTMENT TO ADDRESS HER CONCERNS. PROVIDER STATED THAT PHYSICAL WASN'T DONE DUE TO OTHER CONCERNS BEING ADDRESSED BEFORE PHYSICAL. ADDRESSED OTHER CONCERNS WITH THE OFFICE MANAGER TO SEE WHAT CAN BE DONE.

## 2019-05-26 NOTE — Telephone Encounter (Signed)
Verbal consent given for virtual visit  

## 2019-05-29 NOTE — Telephone Encounter (Signed)
Please call mother if pt authorized mother to do so, and tell her that the appointment I see was to address multiple problems she was dealing with, but other than breast xm, pap, ears, nose throat exam, she had a very thorough exam. Please explain, that for a physical to not receive a charge, there can not be any problems that need to be addressed( this are the insurance rules for billing). In her case, since she had so many issues, even if I had done a physical, she would have received a charge. Please schedule pt for a physical if that is what she wants done( pap and breast xm to be included)

## 2019-05-29 NOTE — Telephone Encounter (Signed)
Per Sophronia Simas due to insurance reasons, want to know if we could change her initial visit code to physical since this will cause fees to increase. I will check with our coder for this.

## 2019-06-03 ENCOUNTER — Encounter: Payer: Self-pay | Admitting: Internal Medicine

## 2019-06-04 ENCOUNTER — Other Ambulatory Visit: Payer: Self-pay

## 2019-06-04 MED ORDER — FLUTICASONE PROPIONATE 50 MCG/ACT NA SUSP: NASAL | 3 refills | Status: AC

## 2019-06-12 ENCOUNTER — Encounter: Payer: Self-pay | Admitting: Internal Medicine

## 2019-09-15 ENCOUNTER — Emergency Department
Admission: EM | Admit: 2019-09-15 | Discharge: 2019-09-15 | Disposition: A | Discharge status: Home / Self Care | Payer: PRIVATE HEALTH INSURANCE | Source: Home / Self Care | Attending: Family Medicine | Admitting: Family Medicine

## 2019-09-15 ENCOUNTER — Other Ambulatory Visit: Payer: Self-pay

## 2019-09-15 ENCOUNTER — Encounter: Payer: Self-pay | Admitting: Emergency Medicine

## 2019-09-15 DIAGNOSIS — J302 Other seasonal allergic rhinitis: Secondary | ICD-10-CM | POA: Diagnosis not present

## 2019-09-15 DIAGNOSIS — J069 Acute upper respiratory infection, unspecified: Secondary | ICD-10-CM | POA: Diagnosis not present

## 2019-09-15 DIAGNOSIS — B9789 Other viral agents as the cause of diseases classified elsewhere: Secondary | ICD-10-CM

## 2019-09-15 MED ORDER — MONTELUKAST SODIUM 10 MG PO TABS: ORAL_TABLET | ORAL | 1 refills | Status: AC

## 2019-09-15 NOTE — Discharge Instructions (Addendum)
Take plain guaifenesin (1200mg extended release tabs such as Mucinex) twice daily, with plenty of water, for cough and congestion.  May add Pseudoephedrine (30mg, one or two every 4 to 6 hours) for sinus congestion.  Get adequate rest.   May use Afrin nasal spray (or generic oxymetazoline) each morning for about 5 days and then discontinue.  Also recommend using saline nasal spray several times daily and saline nasal irrigation (AYR is a common brand).  Use Flonase nasal spray each morning after using Afrin nasal spray and saline nasal irrigation. Try warm salt water gargles for sore throat.  Stop all antihistamines for now, and other non-prescription cough/cold preparations. May take Delsym Cough Suppressant at bedtime for nighttime cough.  

## 2019-09-15 NOTE — ED Provider Notes (Signed)
[facility]    CSN: [id number] Arrival date & time: 09/15/19  1200      History   Chief Complaint Chief Complaint  Patient presents with  . Nasal Congestion    HPI April Osborne is a 23 y.o. female.   Patient has a history of seasonal rhinitis, often worse this time of year.  Three days ago she became fatigued with increased sneezing, nasal congestion, headache, and neck soreness.  She denies cough or fever, and no changes in taste/smell. She notes that her seasonal allergies are often not controlled with Zyrtec.  The history is provided by the patient.    Past Medical History:  Diagnosis Date  . Eosinophilic esophagitis   . H/O seasonal allergies   . Kidney stones     Patient Active Problem List   Diagnosis Date Noted  . 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 Eosinophilic esophagitis 0000000  . Heart burn 07/17/2018  . Personal history of kidney stones 12/31/2014  . Acne 05/31/2013  . Allergic rhinitis 12/27/2012  . Benign breast lumps 12/27/2012  . Scoliosis 02/06/2012    History reviewed. No pertinent surgical history.  OB History   No obstetric history on file.      Home Medications    Prior to Admission medications   Medication Sig Start Date End Date Taking? Authorizing Provider  fluticasone (FLOVENT DISKUS) 50 MCG/BLIST diskus inhaler Inhale 1 puff into the lungs 2 (two) times daily.   Yes [provider]  cetirizine (ZYRTEC) 10 MG tablet Take 1 tablet (10 mg total) by mouth daily. 03/31/19   Rodriguez-Southworth, Sunday Spillers, PA-C  fluticasone (FLONASE) 50 MCG/ACT nasal spray Spray 1-2 times in each nostril at bedtime. 06/04/19   Glendale Chard, MD  JUNEL FE 1.5/30 1.5-30 MG-MCG tablet Take 1 tablet by mouth daily. 09/07/18   [provider]  montelukast (SINGULAIR) 10 MG tablet Take one tab PO HS for allergy symptoms 09/15/19   Kandra Nicolas, MD  omeprazole (PRILOSEC) 40 MG capsule Take 40 mg by mouth daily.     [provider]  tiZANidine (ZANAFLEX) 4 MG tablet Take 1 tablet (4 mg total) by mouth 3 (three) times daily. 03/31/19   Rodriguez-Southworth, Sunday Spillers, PA-C  Vitamin D, Ergocalciferol, (DRISDOL) 1.25 MG (50000 UT) CAPS capsule One twice a week with fatty meal 12/14/18   Rodriguez-Southworth, Sunday Spillers, PA-C    Family History Family History  Problem Relation Age of Onset  . Hypertension Mother   . Hypertension Father   . Cancer Paternal Grandmother   . Diabetes Maternal Grandfather   . Dementia Maternal Grandfather     Social History Social History   Tobacco Use  . Smoking status: Never Smoker  . Smokeless tobacco: Never Used  Substance Use Topics  . Alcohol use: Yes    Comment: social   . Drug use: No     Allergies   Patient has no known allergies.   Review of Systems Review of Systems No sore throat No cough No pleuritic pain No wheezing + nasal congestion + sneezing + post-nasal drainage + sinus pain/pressure No itchy/red eyes No earache No hemoptysis No SOB No fever, + chills No nausea No vomiting No abdominal pain No diarrhea No urinary symptoms No skin rash + fatigue No myalgias No headache Used OTC meds (Flovent) without relief   Physical Exam Triage Vital Signs ED Triage Vitals  Enc Vitals Group     BP 09/15/19 1359 128/78     Pulse Rate 09/15/19  1359 (!) 117     Resp --      Temp 09/15/19 1359 99.2 F (37.3 C)     Temp Source 09/15/19 1359 Oral     SpO2 09/15/19 1359 100 %     Weight 09/15/19 1400 120 lb (54.4 kg)     Height 09/15/19 1400 5\' 6"  (1.676 m)     Head Circumference --      Peak Flow --      Pain Score 09/15/19 1359 7     Pain Loc --      Pain Edu? --      Excl. in [place]? --    No data found.  Updated Vital Signs BP 128/78 (BP Location: Right Arm)   Pulse (!) 117   Temp 99.2 F (37.3 C) (Oral)   Ht 5\' 6"  (1.676 m)   Wt 54.4 kg   SpO2 100%   BMI 19.37 kg/m   Visual Acuity Right Eye Distance:   Left Eye  Distance:   Bilateral Distance:    Right Eye Near:   Left Eye Near:    Bilateral Near:     Physical Exam Nursing notes and Vital Signs reviewed. Appearance:  Patient appears stated age, and in no acute distress Eyes:  Pupils are equal, round, and reactive to light and accomodation.  Extraocular movement is intact.  Conjunctivae are not inflamed  Ears:  Canals normal.  Tympanic membranes normal.  Nose:  Mildly congested turbinates.  No sinus tenderness. Pharynx:  Normal Neck:  Supple.  Enlarged posterior/lateral nodes are palpated bilaterally, tender to palpation on the left.   Lungs:  Clear to auscultation.  Breath sounds are equal.  Moving air well. Heart:  Regular rate and rhythm without murmurs, rubs, or gallops.  Abdomen:  Nontender without masses or hepatosplenomegaly.  Bowel sounds are present.  No CVA or flank tenderness.  Extremities:  No edema.  Skin:  No rash present.    UC Treatments / Results  Labs (all labs ordered are listed, but only abnormal results are displayed) Labs Reviewed - No data to display  EKG   Radiology No results found.  Procedures Procedures (including critical care time)  Medications Ordered in UC Medications - No data to display  Initial Impression / Assessment and Plan / UC Course  I have reviewed the triage vital signs and the nursing notes.  Pertinent labs & imaging results that were available during my care of the patient were reviewed by me and considered in my medical decision making (see chart for details).    There is no evidence of bacterial infection today.  Treat symptomatically for now  Will begin trial of Singulair for her usual allergy symptoms. Followup with Family Doctor if not improved in about 10 days.   Final Clinical Impressions(s) / UC Diagnoses   Final diagnoses:  Viral URI  Seasonal allergic rhinitis, unspecified trigger     Discharge Instructions     Take plain guaifenesin (1200mg  extended release tabs  such as Mucinex) twice daily, with plenty of water, for cough and congestion.  May add Pseudoephedrine (30mg , one or two every 4 to 6 hours) for sinus congestion.  Get adequate rest.   May use Afrin nasal spray (or generic oxymetazoline) each morning for about 5 days and then discontinue.  Also recommend using saline nasal spray several times daily and saline nasal irrigation (AYR is a common brand).  Use Flonase nasal spray each morning after using Afrin nasal spray and saline  nasal irrigation. Try warm salt water gargles for sore throat.  Stop all antihistamines for now, and other non-prescription cough/cold preparations. May take Delsym Cough Suppressant at bedtime for nighttime cough.       ED Prescriptions    Medication Sig Dispense Auth. Provider   montelukast (SINGULAIR) 10 MG tablet Take one tab PO HS for allergy symptoms 30 tablet Kandra Nicolas, MD        Kandra Nicolas, MD 09/18/19 1043

## 2019-09-15 NOTE — ED Triage Notes (Signed)
Runny nose, sneezing, congestion, sinus pain, pressure x 4 days.

## 2019-11-06 IMAGING — US ULTRASOUND LEFT BREAST LIMITED
1 series · 6 of 6 positions shown · non-contrast
Comparison: 07/10/2017 and earlier

CLINICAL DATA: One year follow-up for probably benign LEFT breast
fibroadenoma. Patient has other bilateral breast masses which have
demonstrated 2 year stability by ultrasound. The patient reports no
significant change in the mass in the UPPER-OUTER QUADRANT of the
LEFT breast.

EXAM:
ULTRASOUND OF THE LEFT BREAST

[Series 1: ultrasound left breast limited · 0.05mm/px · 6 of 6 slices shown]
[im 1/6]
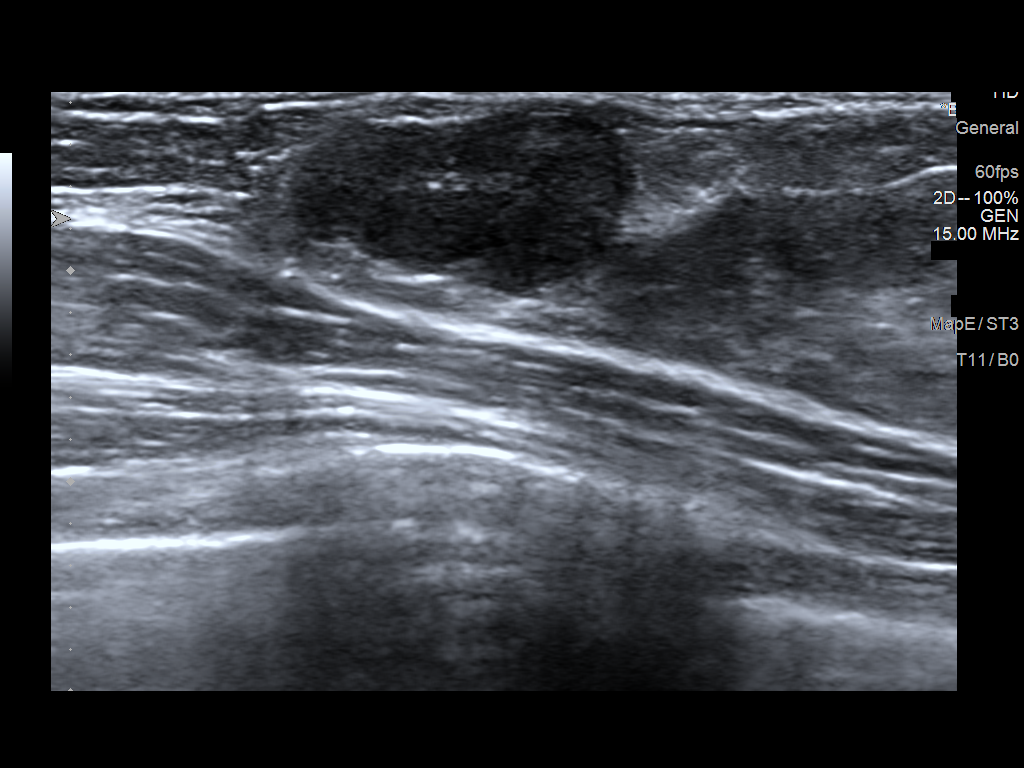
[im 2/6]
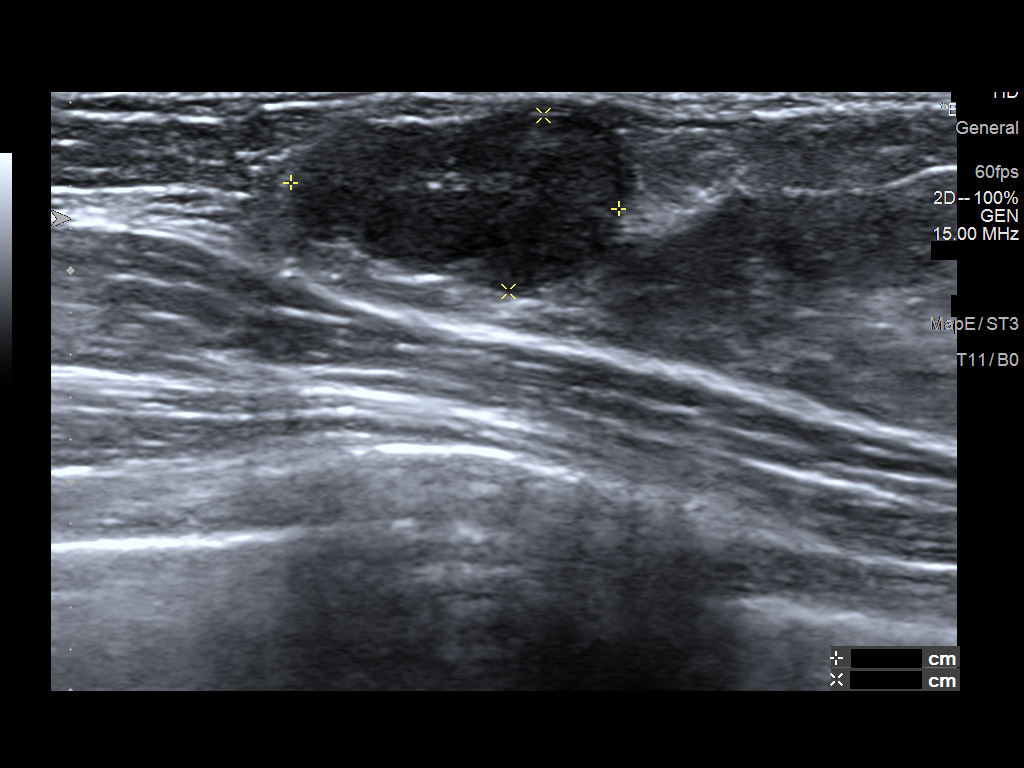
[im 3/6]
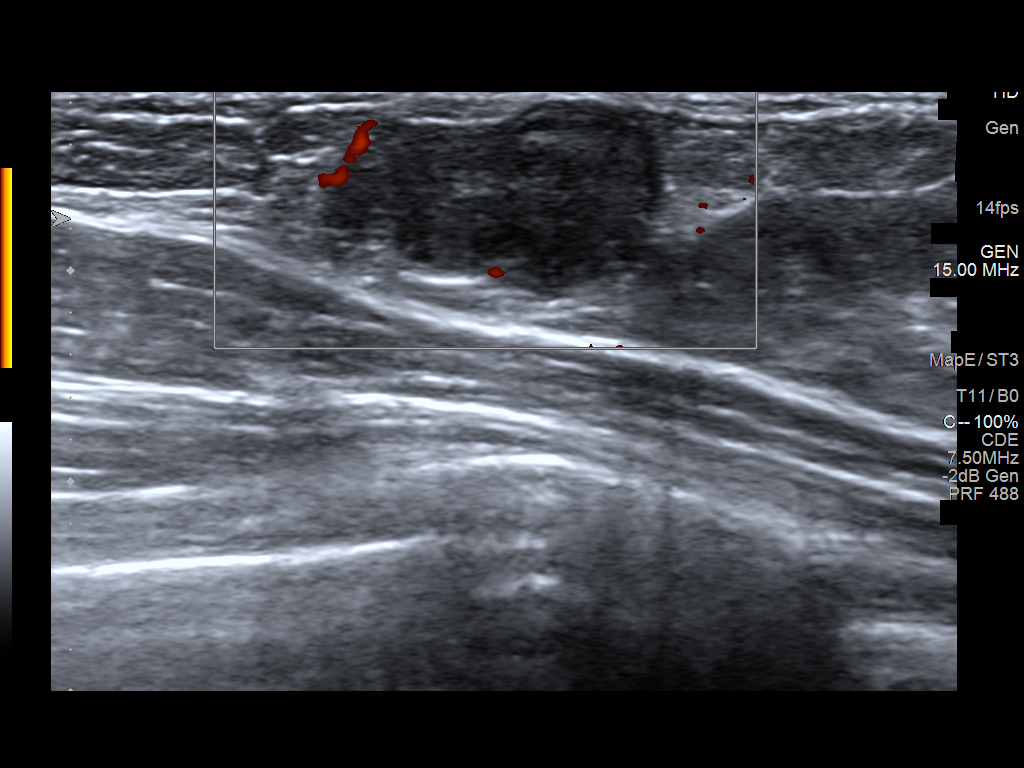
[im 4/6]
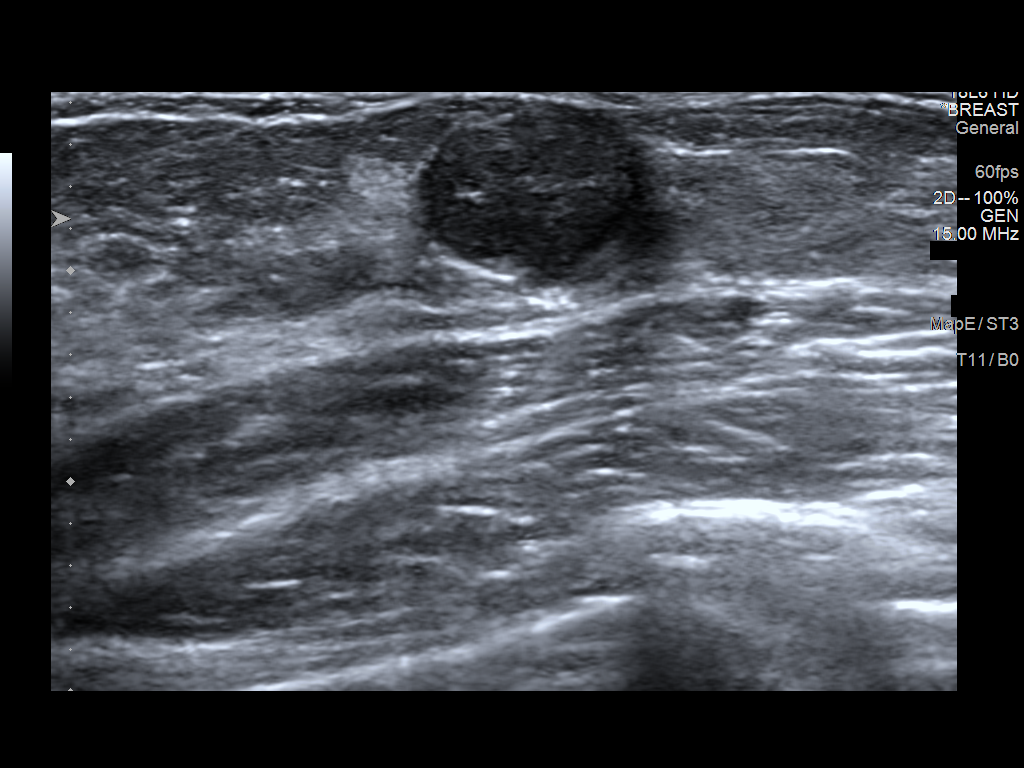
[im 5/6]
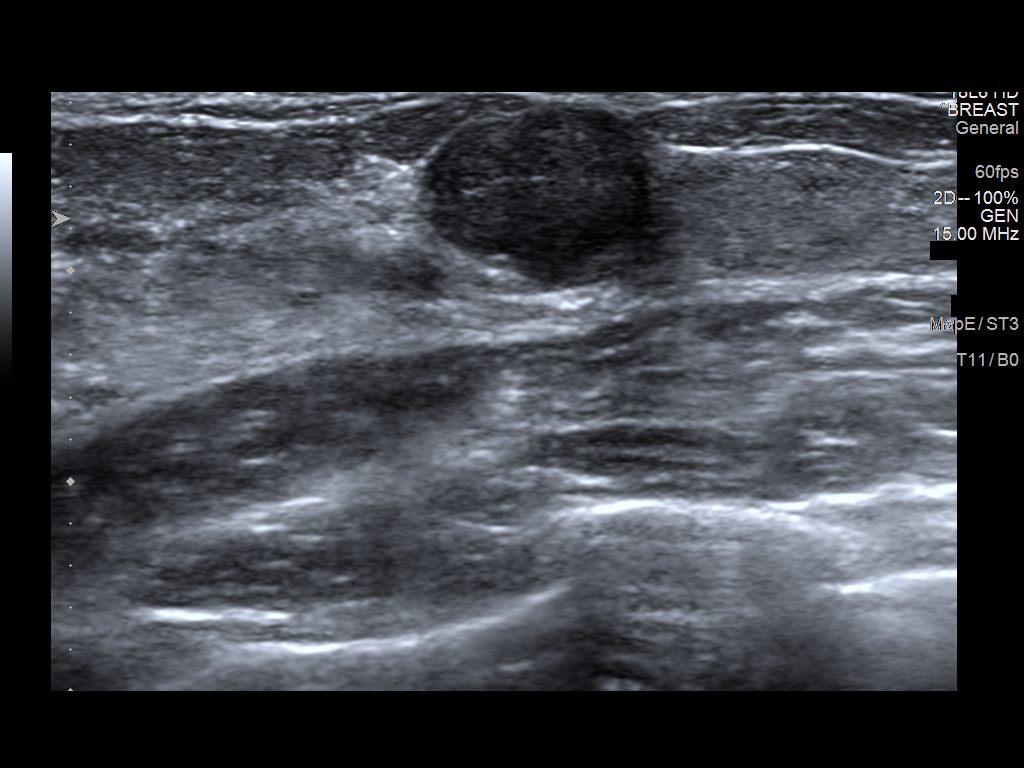
[im 6/6]
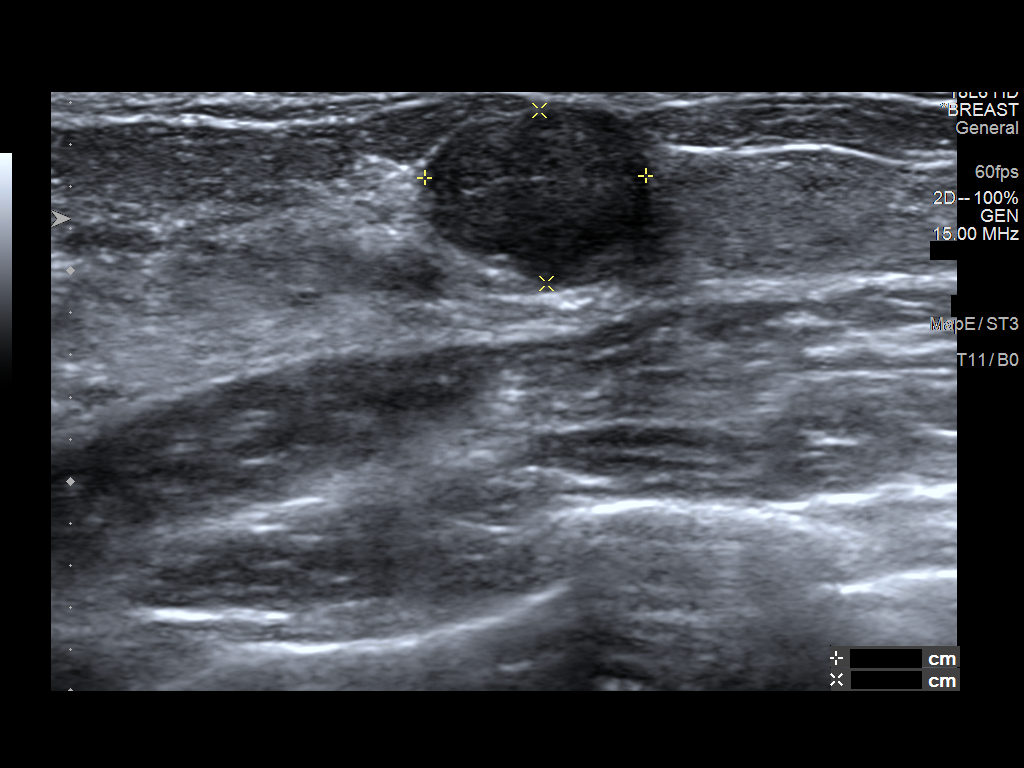

[6 of 6 positions shown; findings below may reference images not displayed]

FINDINGS: On physical exam, I palpate a rounded mobile mass in the UPPER-OUTER
QUADRANT of the LEFT breast.

Targeted ultrasound is performed, showing a circumscribed oval
parallel mass with associated posterior acoustic enhancement in the
1 o'clock location of the LEFT breast 5 centimeters from the nipple.
Mass measures 1.6 x 1.1 x 0.8 centimeters.
IMPRESSION: Stable probable fibroadenoma in the LEFT breast. Continued followup
is recommended to document 2 years of stability.

RECOMMENDATION:
LEFT breast ultrasound is recommended in 1 year to assess the mass
in the 1 o'clock location and document stability.

I have discussed the findings and recommendations with the patient.
Results were also provided in writing at the conclusion of the
visit. If applicable, a reminder letter will be sent to the patient
regarding the next appointment.

BI-RADS CATEGORY  3: Probably benign.

## 2021-10-24 ENCOUNTER — Other Ambulatory Visit: Payer: Self-pay | Admitting: Physician Assistant

## 2021-10-24 DIAGNOSIS — N6320 Unspecified lump in the left breast, unspecified quadrant: Secondary | ICD-10-CM

## 2021-12-14 ENCOUNTER — Other Ambulatory Visit: Payer: PRIVATE HEALTH INSURANCE
